# Patient Record
Sex: Female | Born: 1960 | Race: White | Hispanic: No | Marital: Married | Smoking: Former smoker
Health system: Southern US, Community
[De-identification: ages and names within clinical notes are randomized; demographics above are authoritative.]

## PROBLEM LIST (undated history)

## (undated) DIAGNOSIS — M25569 Pain in unspecified knee: Secondary | ICD-10-CM

## (undated) DIAGNOSIS — E039 Hypothyroidism, unspecified: Secondary | ICD-10-CM

## (undated) DIAGNOSIS — H8090 Unspecified otosclerosis, unspecified ear: Secondary | ICD-10-CM

## (undated) DIAGNOSIS — E785 Hyperlipidemia, unspecified: Secondary | ICD-10-CM

## (undated) DIAGNOSIS — E559 Vitamin D deficiency, unspecified: Secondary | ICD-10-CM

## (undated) DIAGNOSIS — Z8781 Personal history of (healed) traumatic fracture: Secondary | ICD-10-CM

## (undated) DIAGNOSIS — H9193 Unspecified hearing loss, bilateral: Secondary | ICD-10-CM

## (undated) DIAGNOSIS — Z780 Asymptomatic menopausal state: Secondary | ICD-10-CM

## (undated) DIAGNOSIS — T7840XA Allergy, unspecified, initial encounter: Secondary | ICD-10-CM

## (undated) DIAGNOSIS — E538 Deficiency of other specified B group vitamins: Secondary | ICD-10-CM

## (undated) DIAGNOSIS — E663 Overweight: Secondary | ICD-10-CM

## (undated) DIAGNOSIS — E079 Disorder of thyroid, unspecified: Secondary | ICD-10-CM

## (undated) DIAGNOSIS — F419 Anxiety disorder, unspecified: Secondary | ICD-10-CM

## (undated) HISTORY — DX: Overweight: E66.3

## (undated) HISTORY — DX: Pain in unspecified knee: M25.569

## (undated) HISTORY — DX: Hyperlipidemia, unspecified: E78.5

## (undated) HISTORY — DX: Anxiety disorder, unspecified: F41.9

## (undated) HISTORY — PX: KNEE SURGERY: SHX244

## (undated) HISTORY — DX: Allergy, unspecified, initial encounter: T78.40XA

## (undated) HISTORY — DX: Personal history of (healed) traumatic fracture: Z87.81

## (undated) HISTORY — DX: Disorder of thyroid, unspecified: E07.9

## (undated) HISTORY — PX: EXTERNAL EAR SURGERY: SHX627

## (undated) HISTORY — DX: Unspecified otosclerosis, unspecified ear: H80.90

## (undated) HISTORY — DX: Deficiency of other specified B group vitamins: E53.8

## (undated) HISTORY — DX: Asymptomatic menopausal state: Z78.0

## (undated) HISTORY — DX: Unspecified hearing loss, bilateral: H91.93

## (undated) HISTORY — DX: Vitamin D deficiency, unspecified: E55.9

---

## 2006-03-02 ENCOUNTER — Ambulatory Visit: Payer: Self-pay | Admitting: Unknown Physician Specialty

## 2008-04-22 ENCOUNTER — Ambulatory Visit: Payer: Self-pay | Admitting: Physician Assistant

## 2008-06-25 ENCOUNTER — Ambulatory Visit: Payer: Self-pay | Admitting: Gastroenterology

## 2008-06-25 LAB — HM COLONOSCOPY

## 2008-07-07 ENCOUNTER — Ambulatory Visit: Payer: Self-pay | Admitting: Orthopedic Surgery

## 2009-03-04 ENCOUNTER — Ambulatory Visit: Payer: Self-pay | Admitting: Orthopedic Surgery

## 2009-03-11 ENCOUNTER — Ambulatory Visit: Payer: Self-pay | Admitting: Orthopedic Surgery

## 2009-11-18 ENCOUNTER — Ambulatory Visit: Payer: Self-pay | Admitting: Family Medicine

## 2009-12-06 ENCOUNTER — Ambulatory Visit: Payer: Self-pay | Admitting: Family Medicine

## 2011-09-20 ENCOUNTER — Ambulatory Visit: Payer: Self-pay | Admitting: Family Medicine

## 2011-09-22 LAB — HM PAP SMEAR: HM PAP: NORMAL

## 2012-11-29 ENCOUNTER — Ambulatory Visit: Payer: Self-pay | Admitting: Family Medicine

## 2013-01-29 ENCOUNTER — Ambulatory Visit: Payer: Self-pay | Admitting: Orthopedic Surgery

## 2013-01-29 DIAGNOSIS — Z8781 Personal history of (healed) traumatic fracture: Secondary | ICD-10-CM | POA: Insufficient documentation

## 2013-04-22 ENCOUNTER — Ambulatory Visit: Payer: Self-pay | Admitting: Orthopedic Surgery

## 2013-12-06 LAB — LIPID PANEL
Cholesterol: 223 — AB (ref 0–200)
HDL: 72 — AB (ref 35–70)
LDL CALC: 129
TRIGLYCERIDES: 110 (ref 40–160)

## 2013-12-06 LAB — BASIC METABOLIC PANEL: GLUCOSE: 90

## 2013-12-06 LAB — HEMOGLOBIN A1C: Hemoglobin A1C: 5.3

## 2014-01-26 LAB — HM MAMMOGRAPHY: HM Mammogram: NORMAL

## 2014-01-27 ENCOUNTER — Ambulatory Visit: Payer: Self-pay | Admitting: Family Medicine

## 2014-03-06 ENCOUNTER — Other Ambulatory Visit: Payer: Self-pay | Admitting: Oncology

## 2014-03-13 ENCOUNTER — Ambulatory Visit: Payer: Self-pay | Admitting: Gastroenterology

## 2014-08-14 NOTE — Op Note (Signed)
PATIENT NAME:  Taylor Gilmore, Taylor Gilmore MR#:  [id number] DATE OF BIRTH:  10-21-1960  DATE OF PROCEDURE:  01/29/2013  PREOPERATIVE DIAGNOSIS: Left shoulder fracture-dislocation.   POSTOPERATIVE DIAGNOSIS: Left shoulder fracture-dislocation.   PROCEDURE: Closed reduction left glenohumeral dislocation and tuberosity fracture.   ANESTHESIA:  General.  SURGEON: Hessie Knows, M.D.   DESCRIPTION OF PROCEDURE: The patient was brought to the operating room and after adequate anesthesia was obtained, appropriate patient identification and timeout procedures were completed. A C-arm was brought in and under fluoroscopic exam, reduction was attempted first applying traction and this gave very little improvement in position. With significant internal rotation and lateral traction, the humeral head and found to slide against the glenoid and a palpable reduction was felt with the arm brought into adduction against the chest wall and the hand against the abdomen. The tuberosity fracture appeared to be essentially anatomically reduced. A shoulder immobilizer was applied and the patient was sent to the recovery room in stable condition and there were no blood loss. No complications. No implants. C-arm view was saved showing the shoulder relocated.     ____________________________ Laurene Footman, MD mjm:cc D: 01/29/2013 19:30:55 ET T: 01/29/2013 20:23:42 ET JOB#: [id number]  cc: Laurene Footman, MD, <Dictator> Laurene Footman MD ELECTRONICALLY SIGNED 01/30/2013 6:56

## 2014-08-14 NOTE — H&P (Signed)
   Subjective/Chief Complaint left shoulder pain   History of Present Illness left arm went out from under her when planking at [facility] during exercise class.  Unsuccessful attemps in ER to reduce shoulder. right hand dominant, no prior left shoulder problems   Past Med/Surgical Hx:  Hypercholesterolemia:   Hypothyroidism:   Left ear surgery:   ALLERGIES:  No Known Allergies:   HOME MEDICATIONS: Medication Instructions Status  simvastatin 10 mg oral tablet 1 tab(s) orally once a day (at bedtime) Active  Synthroid 75 mcg (0.075 mg) oral tablet 1 tab(s) orally once a day Active  Vitamin B-12 1 tab(s) orally 3 to 4 times a week Active  Vitamin D3 1 tab(s) orally once a day Active   Family and Social History:  Family History Non-Contributory   Social History negative tobacco   Place of Living Home   Review of Systems:  Subjective/Chief Complaint left shoulder pain, severe   Physical Exam:  GEN well developed, well nourished   HEENT hearing intact to voice, good dentition   NECK supple   RESP normal resp effort  clear BS   CARD regular rate  no murmur   ABD denies tenderness   EXTR N/V intact left arm, anterior shoulder fullness secondary to dislocation   SKIN normal to palpation   NEURO motor/sensory function intact   PSYCH alert, A+O to time, place, person    Assessment/Admission Diagnosis shoulder fracture dislocation, unable to reduce in ER with iv sedation.   Plan closed reduction under anesthesia. possible open reduction if closed reduction fails. discussed risks, benefits and possible complications and she understands.   Electronic Signatures: Laurene Footman (MD)  (Signed 08-Oct-14 16:08)  Authored: CHIEF COMPLAINT and HISTORY, PAST MEDICAL/SURGIAL HISTORY, ALLERGIES, HOME MEDICATIONS, FAMILY AND SOCIAL HISTORY, REVIEW OF SYSTEMS, PHYSICAL EXAM, ASSESSMENT AND PLAN   Last Updated: 08-Oct-14 16:08 by Laurene Footman (MD)

## 2014-08-18 LAB — LIPID PANEL
CHOLESTEROL: 237 mg/dL — AB (ref 0–200)
HDL: 65 mg/dL (ref 35–70)
LDL CALC: 142 mg/dL
TRIGLYCERIDES: 152 mg/dL (ref 40–160)

## 2014-11-30 ENCOUNTER — Other Ambulatory Visit: Payer: Self-pay

## 2014-11-30 MED ORDER — LEVOTHYROXINE SODIUM 100 MCG PO TABS: 100.0000 ug | ORAL_TABLET | Freq: Every day | ORAL | Status: DC

## 2014-11-30 NOTE — Telephone Encounter (Signed)
Patient requesting refill. 

## 2014-12-31 ENCOUNTER — Other Ambulatory Visit: Payer: Self-pay | Admitting: Family Medicine

## 2014-12-31 DIAGNOSIS — Z1231 Encounter for screening mammogram for malignant neoplasm of breast: Secondary | ICD-10-CM

## 2015-01-04 ENCOUNTER — Telehealth: Payer: Self-pay | Admitting: Family Medicine

## 2015-01-04 DIAGNOSIS — H9193 Unspecified hearing loss, bilateral: Secondary | ICD-10-CM | POA: Insufficient documentation

## 2015-01-04 DIAGNOSIS — F419 Anxiety disorder, unspecified: Secondary | ICD-10-CM | POA: Insufficient documentation

## 2015-01-04 DIAGNOSIS — E785 Hyperlipidemia, unspecified: Secondary | ICD-10-CM

## 2015-01-04 DIAGNOSIS — M25562 Pain in left knee: Secondary | ICD-10-CM | POA: Insufficient documentation

## 2015-01-04 DIAGNOSIS — H8093 Unspecified otosclerosis, bilateral: Secondary | ICD-10-CM

## 2015-01-04 DIAGNOSIS — E663 Overweight: Secondary | ICD-10-CM | POA: Insufficient documentation

## 2015-01-04 DIAGNOSIS — Z79899 Other long term (current) drug therapy: Secondary | ICD-10-CM

## 2015-01-04 DIAGNOSIS — E039 Hypothyroidism, unspecified: Secondary | ICD-10-CM | POA: Insufficient documentation

## 2015-01-04 DIAGNOSIS — Z131 Encounter for screening for diabetes mellitus: Secondary | ICD-10-CM

## 2015-01-04 DIAGNOSIS — E538 Deficiency of other specified B group vitamins: Secondary | ICD-10-CM

## 2015-01-04 DIAGNOSIS — E559 Vitamin D deficiency, unspecified: Secondary | ICD-10-CM

## 2015-01-04 DIAGNOSIS — F413 Other mixed anxiety disorders: Secondary | ICD-10-CM

## 2015-01-04 NOTE — Telephone Encounter (Signed)
PT WANTS LAB SLIP FOR LABS BEFORE HER APPT ON 01-11-15.

## 2015-01-09 LAB — CBC WITH DIFFERENTIAL/PLATELET
BASOS: 1 %
Basophils Absolute: 0 10*3/uL (ref 0.0–0.2)
EOS (ABSOLUTE): 0.1 10*3/uL (ref 0.0–0.4)
EOS: 3 %
HEMATOCRIT: 37.8 % (ref 34.0–46.6)
Hemoglobin: 12.5 g/dL (ref 11.1–15.9)
Immature Grans (Abs): 0 10*3/uL (ref 0.0–0.1)
Immature Granulocytes: 0 %
LYMPHS ABS: 1.4 10*3/uL (ref 0.7–3.1)
Lymphs: 35 %
MCH: 30.9 pg (ref 26.6–33.0)
MCHC: 33.1 g/dL (ref 31.5–35.7)
MCV: 93 fL (ref 79–97)
MONOCYTES: 10 %
MONOS ABS: 0.4 10*3/uL (ref 0.1–0.9)
NEUTROS ABS: 2.1 10*3/uL (ref 1.4–7.0)
Neutrophils: 51 %
Platelets: 193 10*3/uL (ref 150–379)
RBC: 4.05 x10E6/uL (ref 3.77–5.28)
RDW: 13.4 % (ref 12.3–15.4)
WBC: 4 10*3/uL (ref 3.4–10.8)

## 2015-01-09 LAB — VITAMIN B12: Vitamin B-12: 823 pg/mL (ref 211–946)

## 2015-01-09 LAB — COMPREHENSIVE METABOLIC PANEL
ALBUMIN: 4.5 g/dL (ref 3.5–5.5)
ALT: 16 IU/L (ref 0–32)
AST: 22 IU/L (ref 0–40)
Albumin/Globulin Ratio: 2.5 (ref 1.1–2.5)
Alkaline Phosphatase: 62 IU/L (ref 39–117)
BUN / CREAT RATIO: 18 (ref 9–23)
BUN: 13 mg/dL (ref 6–24)
Bilirubin Total: 0.2 mg/dL (ref 0.0–1.2)
CO2: 26 mmol/L (ref 18–29)
CREATININE: 0.74 mg/dL (ref 0.57–1.00)
Calcium: 9.1 mg/dL (ref 8.7–10.2)
Chloride: 103 mmol/L (ref 97–108)
GFR, EST AFRICAN AMERICAN: 107 mL/min/{1.73_m2} (ref >59)
GFR, EST NON AFRICAN AMERICAN: 93 mL/min/{1.73_m2} (ref >59)
GLOBULIN, TOTAL: 1.8 g/dL (ref 1.5–4.5)
GLUCOSE: 92 mg/dL (ref 65–99)
Potassium: 4.6 mmol/L (ref 3.5–5.2)
SODIUM: 142 mmol/L (ref 134–144)
TOTAL PROTEIN: 6.3 g/dL (ref 6.0–8.5)

## 2015-01-09 LAB — LIPID PANEL
CHOL/HDL RATIO: 3.6 ratio (ref 0.0–4.4)
Cholesterol, Total: 234 mg/dL — ABNORMAL HIGH (ref 100–199)
HDL: 65 mg/dL (ref >39)
LDL Calculated: 149 mg/dL — ABNORMAL HIGH (ref 0–99)
TRIGLYCERIDES: 101 mg/dL (ref 0–149)
VLDL Cholesterol Cal: 20 mg/dL (ref 5–40)

## 2015-01-09 LAB — TSH: TSH: 1.38 u[IU]/mL (ref 0.450–4.500)

## 2015-01-09 LAB — HEMOGLOBIN A1C
Est. average glucose Bld gHb Est-mCnc: 111 mg/dL
Hgb A1c MFr Bld: 5.5 % (ref 4.8–5.6)

## 2015-01-09 LAB — VITAMIN D 25 HYDROXY (VIT D DEFICIENCY, FRACTURES): Vit D, 25-Hydroxy: 29.5 ng/mL — ABNORMAL LOW (ref 30.0–100.0)

## 2015-01-10 ENCOUNTER — Encounter: Payer: Self-pay | Admitting: Family Medicine

## 2015-01-10 DIAGNOSIS — N951 Menopausal and female climacteric states: Secondary | ICD-10-CM | POA: Insufficient documentation

## 2015-01-10 DIAGNOSIS — H8093 Unspecified otosclerosis, bilateral: Secondary | ICD-10-CM | POA: Insufficient documentation

## 2015-01-10 DIAGNOSIS — M25569 Pain in unspecified knee: Secondary | ICD-10-CM | POA: Insufficient documentation

## 2015-01-10 DIAGNOSIS — J309 Allergic rhinitis, unspecified: Secondary | ICD-10-CM | POA: Insufficient documentation

## 2015-01-11 ENCOUNTER — Ambulatory Visit (INDEPENDENT_AMBULATORY_CARE_PROVIDER_SITE_OTHER): Payer: 59 | Admitting: Family Medicine

## 2015-01-11 ENCOUNTER — Encounter: Payer: Self-pay | Admitting: Family Medicine

## 2015-01-11 VITALS — BP 124/70 | HR 75 | Temp 97.8°F | Resp 16 | Ht 63.0 in | Wt 154.8 lb

## 2015-01-11 DIAGNOSIS — Z0000 Encounter for general adult medical examination without abnormal findings: Secondary | ICD-10-CM | POA: Diagnosis not present

## 2015-01-11 DIAGNOSIS — Z1239 Encounter for other screening for malignant neoplasm of breast: Secondary | ICD-10-CM

## 2015-01-11 DIAGNOSIS — Z7189 Other specified counseling: Secondary | ICD-10-CM | POA: Diagnosis not present

## 2015-01-11 DIAGNOSIS — Z124 Encounter for screening for malignant neoplasm of cervix: Secondary | ICD-10-CM

## 2015-01-11 DIAGNOSIS — Z719 Counseling, unspecified: Secondary | ICD-10-CM

## 2015-01-11 DIAGNOSIS — Z23 Encounter for immunization: Secondary | ICD-10-CM | POA: Diagnosis not present

## 2015-01-11 DIAGNOSIS — Z01419 Encounter for gynecological examination (general) (routine) without abnormal findings: Secondary | ICD-10-CM

## 2015-01-11 DIAGNOSIS — Z114 Encounter for screening for human immunodeficiency virus [HIV]: Secondary | ICD-10-CM | POA: Diagnosis not present

## 2015-01-11 MED ORDER — ASPIRIN EC 81 MG PO TBEC: 81.0000 mg | DELAYED_RELEASE_TABLET | Freq: Every day | ORAL | Status: DC

## 2015-01-11 MED ORDER — LEVOTHYROXINE SODIUM 100 MCG PO TABS: 100.0000 ug | ORAL_TABLET | Freq: Every day | ORAL | Status: DC

## 2015-01-11 NOTE — Progress Notes (Signed)
Name: Taylor Gilmore   MRN: [id number]    DOB: Dec 06, 1960   Date:01/11/2015       Progress Note  Subjective  Chief Complaint  Chief Complaint  Patient presents with  . Annual Exam    HPI  Well Woman: she is having some sinus/allergy symptoms and is taking otc medication prn, otherwise no problems. She denies urge incontinence, only has stress incontinence with jumping jacks  Patient Active Problem List   Diagnosis Date Noted  . Anterior knee pain 01/10/2015  . Symptomatic menopausal or female climacteric states 01/10/2015  . Otosclerosis of both ears 01/10/2015  . Allergic rhinitis 01/10/2015  . Hypothyroidism, adult 01/04/2015  . Dyslipidemia 01/04/2015  . Overweight (BMI 25.0-29.9) 01/04/2015  . Vitamin D deficiency 01/04/2015  . B12 deficiency 01/04/2015  . Left anterior knee pain 01/04/2015  . Bilateral hearing loss 01/04/2015  . Anxiety disorder 01/04/2015  . Personal history of healed traumatic fracture 01/29/2013    Past Surgical History  Procedure Laterality Date  . Knee surgery Right   . External ear surgery Bilateral     Family History  Problem Relation Age of Onset  . Hypertension Mother   . Alcohol abuse Father   . Cancer Father     Colon    Social History   Social History  . Marital Status: Married    Spouse Name: N/A  . Number of Children: N/A  . Years of Education: N/A   Occupational History  . Not on file.   Social History Main Topics  . Smoking status: Former Smoker    Quit date: 04/24/1993  . Smokeless tobacco: Not on file  . Alcohol Use: No  . Drug Use: No  . Sexual Activity: Not Currently   Other Topics Concern  . Not on file   Social History Narrative     Current outpatient prescriptions:  .  Cholecalciferol (VITAMIN D HIGH POTENCY) 1000 UNITS capsule, Take 1 tablet by mouth daily., Disp: , Rfl:  .  LORazepam (ATIVAN) 1 MG tablet, Take 1 tablet by mouth as needed., Disp: , Rfl:  .  levothyroxine (SYNTHROID,  LEVOTHROID) 100 MCG tablet, Take 1 tablet (100 mcg total) by mouth daily. Take 1 tablet by mouth Monday through Saturday and 1 and 1/2 tablets by mouth on Sunday, Disp: 98 tablet, Rfl: 0 .  Methylcobalamin (B12-ACTIVE) 1 MG CHEW, Chew 1 tablet by mouth daily., Disp: , Rfl:   No Known Allergies   ROS  Constitutional: Negative for fever or significant  weight change.  Respiratory: Negative for cough and shortness of breath.   Cardiovascular: Negative for chest pain or palpitations.  Gastrointestinal: Negative for abdominal pain, no bowel changes.  Musculoskeletal: Negative for gait problem or joint swelling.  Skin: Negative for rash.  Neurological: Negative for dizziness or headache.  No other specific complaints in a complete review of systems (except as listed in HPI above).  Objective  Filed Vitals:   01/11/15 0838  BP: 124/70  Pulse: 75  Temp: 97.8 F (36.6 C)  TempSrc: Oral  Resp: 16  Height: 5\' 3"  (1.6 m)  Weight: 154 lb 12.8 oz (70.217 kg)  SpO2: 98%    Body mass index is 27.43 kg/(m^2).  Physical Exam  Constitutional: Patient appears well-developed and well-nourished. No distress.  HENT: Head: Normocephalic and atraumatic. Ears: scarring on both TM; Nose: Nose normal. Mouth/Throat: Oropharynx is clear and moist. No oropharyngeal exudate.  Eyes: Conjunctivae and EOM are normal. Pupils are equal, round, and reactive  to light. No scleral icterus.  Neck: Normal range of motion. Neck supple. No JVD present. No thyromegaly present.  Cardiovascular: Normal rate, regular rhythm and normal heart sounds.  No murmur heard. No BLE edema. Pulmonary/Chest: Effort normal and breath sounds normal. No respiratory distress. Abdominal: Soft. Bowel sounds are normal, no distension. There is no tenderness. no masses Breast: no lumps or masses, no nipple discharge or rashes FEMALE GENITALIA:  External genitalia normal External urethra normal Vaginal vault normal without discharge or  lesions. Vaginal atrophy noted Cervix normal without discharge or lesions Bimanual exam normal without masses RECTAL: not done Musculoskeletal: Normal range of motion, no joint effusions. No gross deformities Neurological: he is alert and oriented to person, place, and time. No cranial nerve deficit. Coordination, balance, strength, speech and gait are normal.  Skin: Skin is warm and dry. No rash noted. No erythema.  Psychiatric: Patient has a normal mood and affect. behavior is normal. Judgment and thought content normal.  Recent Results (from the past 2160 hour(s))  TSH     Status: None   Collection Time: 01/08/15  8:50 AM  Result Value Ref Range   TSH 1.380 0.450 - 4.500 uIU/mL  Vitamin B12     Status: None   Collection Time: 01/08/15  8:50 AM  Result Value Ref Range   Vitamin B-12 823 211 - 946 pg/mL  Vit D  25 hydroxy (rtn osteoporosis monitoring)     Status: Abnormal   Collection Time: 01/08/15  8:50 AM  Result Value Ref Range   Vit D, 25-Hydroxy 29.5 (L) 30.0 - 100.0 ng/mL    Comment: Vitamin D deficiency has been defined by the [place] and an Endocrine Society practice guideline as a level of serum 25-OH vitamin D less than 20 ng/mL (1,2). The Endocrine Society went on to further define vitamin D insufficiency as a level between 21 and 29 ng/mL (2). 1. IOM (Institute of Medicine). 2010. Dietary reference    intakes for calcium and D. [place]: The    [employer]. 2. Holick MF, Binkley [place], Bischoff-Ferrari HA, et al.    Evaluation, treatment, and prevention of vitamin D    deficiency: an Endocrine Society clinical practice    guideline. JCEM. 2011 Jul; 96(7):1911-30.   Comprehensive metabolic panel     Status: None   Collection Time: 01/08/15  8:50 AM  Result Value Ref Range   Glucose 92 65 - 99 mg/dL   BUN 13 6 - 24 mg/dL   Creatinine, Ser 0.74 0.57 - 1.00 mg/dL   GFR calc non Af Amer 93 >59 mL/min/1.73   GFR calc Af Amer 107 >59  mL/min/1.73   BUN/Creatinine Ratio 18 9 - 23   Sodium 142 134 - 144 mmol/L   Potassium 4.6 3.5 - 5.2 mmol/L   Chloride 103 97 - 108 mmol/L   CO2 26 18 - 29 mmol/L   Calcium 9.1 8.7 - 10.2 mg/dL   Total Protein 6.3 6.0 - 8.5 g/dL   Albumin 4.5 3.5 - 5.5 g/dL   Globulin, Total 1.8 1.5 - 4.5 g/dL   Albumin/Globulin Ratio 2.5 1.1 - 2.5   Bilirubin Total 0.2 0.0 - 1.2 mg/dL   Alkaline Phosphatase 62 39 - 117 IU/L   AST 22 0 - 40 IU/L   ALT 16 0 - 32 IU/L  CBC with Differential/Platelet     Status: None   Collection Time: 01/08/15  8:50 AM  Result Value Ref Range   WBC  4.0 3.4 - 10.8 x10E3/uL   RBC 4.05 3.77 - 5.28 x10E6/uL   Hemoglobin 12.5 11.1 - 15.9 g/dL   Hematocrit 37.8 34.0 - 46.6 %   MCV 93 79 - 97 fL   MCH 30.9 26.6 - 33.0 pg   MCHC 33.1 31.5 - 35.7 g/dL   RDW 13.4 12.3 - 15.4 %   Platelets 193 150 - 379 x10E3/uL   Neutrophils 51 %   Lymphs 35 %   Monocytes 10 %   Eos 3 %   Basos 1 %   Neutrophils Absolute 2.1 1.4 - 7.0 x10E3/uL   Lymphocytes Absolute 1.4 0.7 - 3.1 x10E3/uL   Monocytes Absolute 0.4 0.1 - 0.9 x10E3/uL   EOS (ABSOLUTE) 0.1 0.0 - 0.4 x10E3/uL   Basophils Absolute 0.0 0.0 - 0.2 x10E3/uL   Immature Granulocytes 0 %   Immature Grans (Abs) 0.0 0.0 - 0.1 x10E3/uL  Lipid panel     Status: Abnormal   Collection Time: 01/08/15  8:50 AM  Result Value Ref Range   Cholesterol, Total 234 (H) 100 - 199 mg/dL   Triglycerides 101 0 - 149 mg/dL   HDL 65 >39 mg/dL    Comment: According to ATP-III Guidelines, HDL-C >59 mg/dL is considered a negative risk factor for CHD.    VLDL Cholesterol Cal 20 5 - 40 mg/dL   LDL Calculated 149 (H) 0 - 99 mg/dL   Chol/HDL Ratio 3.6 0.0 - 4.4 ratio units    Comment:                                   T. Chol/HDL Ratio                                             Men  Women                               1/2 Avg.Risk  3.4    3.3                                   Avg.Risk  5.0    4.4                                2X Avg.Risk  9.6     7.1                                3X Avg.Risk 23.4   11.0   Hemoglobin A1c     Status: None   Collection Time: 01/08/15  8:50 AM  Result Value Ref Range   Hgb A1c MFr Bld 5.5 4.8 - 5.6 %    Comment:          Pre-diabetes: 5.7 - 6.4          Diabetes: >6.4          Glycemic control for adults with diabetes: <7.0    Est. average glucose Bld gHb Est-mCnc 111 mg/dL      PHQ2/9: Depression screen [facility] 2/9 01/11/2015  Decreased Interest 0  Down, Depressed, Hopeless 0  PHQ - 2 Score 0     Fall Risk: Fall Risk  01/11/2015  Falls in the past year? No      Functional Status Survey: Is the patient deaf or have difficulty hearing?: No Does the patient have difficulty seeing, even when wearing glasses/contacts?: Yes (glasses) Does the patient have difficulty concentrating, remembering, or making decisions?: No Does the patient have difficulty walking or climbing stairs?: No Does the patient have difficulty dressing or bathing?: No Does the patient have difficulty doing errands alone such as visiting a doctor's office or shopping?: No    Assessment & Plan  1. Well woman exam   2. Needs flu shot  - Flu Vaccine QUAD 36+ mos PF IM (Fluarix & Fluzone Quad PF)  3. Health counseling Discussed importance of 150 minutes of physical activity weekly, eat two servings of fish weekly, eat one serving of tree nuts ( cashews, pistachios, pecans, almonds.Marland Kitchen) every other day, eat 6 servings of fruit/vegetables daily and drink plenty of water and avoid sweet beverages.    4. Cervical cancer screening  - Pap IG, CT/NG NAA, and HPV (high risk)  5. Breast cancer screening Already scheduled   6. Encounter for screening for HIV  - HIV antibody

## 2015-01-11 NOTE — Patient Instructions (Signed)
Discussed importance of 150 minutes of physical activity weekly, eat two servings of fish weekly, eat one serving of tree nuts ( cashews, pistachios, pecans, almonds.Marland Kitchen) every other day, eat 6 servings of fruit/vegetables daily and drink plenty of water and avoid sweet beverages. Start Aspirin 81 mg daily

## 2015-01-12 LAB — HIV ANTIBODY (ROUTINE TESTING W REFLEX): HIV SCREEN 4TH GENERATION: NONREACTIVE

## 2015-01-13 LAB — PLEASE NOTE

## 2015-01-14 LAB — PAP IG, CT-NG NAA, HPV HIGH-RISK: PAP Smear Comment: 0

## 2015-01-14 NOTE — Progress Notes (Signed)
Patient notified and states she will come back for just a piece of mind to have her Pap redone.

## 2015-01-25 ENCOUNTER — Ambulatory Visit (INDEPENDENT_AMBULATORY_CARE_PROVIDER_SITE_OTHER): Payer: 59 | Admitting: Family Medicine

## 2015-01-25 ENCOUNTER — Encounter: Payer: Self-pay | Admitting: Family Medicine

## 2015-01-25 VITALS — BP 116/66 | HR 76 | Temp 97.5°F | Resp 18 | Ht 63.0 in | Wt 156.1 lb

## 2015-01-25 DIAGNOSIS — Z124 Encounter for screening for malignant neoplasm of cervix: Secondary | ICD-10-CM

## 2015-01-25 NOTE — Progress Notes (Signed)
Name: Taylor Gilmore   MRN: [id number]    DOB: 07-14-1960   Date:01/25/2015       Progress Note  Subjective  Chief Complaint  Chief Complaint  Patient presents with  . Gynecologic Exam    Due to last pap reading did not have enough cells to perform, patient would like pap smear redone.    HPI  See above  Patient Active Problem List   Diagnosis Date Noted  . Anterior knee pain 01/10/2015  . Symptomatic menopausal or female climacteric states 01/10/2015  . Otosclerosis of both ears 01/10/2015  . Allergic rhinitis 01/10/2015  . Hypothyroidism, adult 01/04/2015  . Dyslipidemia 01/04/2015  . Overweight (BMI 25.0-29.9) 01/04/2015  . Vitamin D deficiency 01/04/2015  . B12 deficiency 01/04/2015  . Left anterior knee pain 01/04/2015  . Bilateral hearing loss 01/04/2015  . Anxiety disorder 01/04/2015  . Personal history of healed traumatic fracture 01/29/2013    Social History  Substance Use Topics  . Smoking status: Former Smoker    Quit date: 04/24/1993  . Smokeless tobacco: Not on file  . Alcohol Use: No     Current outpatient prescriptions:  .  aspirin EC 81 MG tablet, Take 1 tablet (81 mg total) by mouth daily., Disp: 30 tablet, Rfl: 0 .  Cholecalciferol (VITAMIN D HIGH POTENCY) 1000 UNITS capsule, Take 1 tablet by mouth daily., Disp: , Rfl:  .  levothyroxine (SYNTHROID, LEVOTHROID) 100 MCG tablet, Take 1 tablet (100 mcg total) by mouth daily. Take 1 tablet by mouth Monday through Saturday and 1 and 1/2 tablets by mouth on Sunday, Disp: 98 tablet, Rfl: 0 .  LORazepam (ATIVAN) 1 MG tablet, Take 1 tablet by mouth as needed., Disp: , Rfl:  .  Methylcobalamin (B12-ACTIVE) 1 MG CHEW, Chew 1 tablet by mouth daily., Disp: , Rfl:   No Known Allergies  ROS  Not applicable  Objective  Filed Vitals:   01/25/15 1530  BP: 116/66  Pulse: 76  Temp: 97.5 F (36.4 C)  TempSrc: Oral  Resp: 18  Height: 5\' 3"  (1.6 m)  Weight: 156 lb 1.6 oz (70.806 kg)  SpO2: 97%     Body mass index is 27.66 kg/(m^2).    Physical Exam  Pelvic exam performed, some vaginal atrophy noticed Pap smear collected - not enough cells on previous pap   Recent Results (from the past 2160 hour(s))  TSH     Status: None   Collection Time: 01/08/15  8:50 AM  Result Value Ref Range   TSH 1.380 0.450 - 4.500 uIU/mL  Vitamin B12     Status: None   Collection Time: 01/08/15  8:50 AM  Result Value Ref Range   Vitamin B-12 823 211 - 946 pg/mL  Vit D  25 hydroxy (rtn osteoporosis monitoring)     Status: Abnormal   Collection Time: 01/08/15  8:50 AM  Result Value Ref Range   Vit D, 25-Hydroxy 29.5 (L) 30.0 - 100.0 ng/mL    Comment: Vitamin D deficiency has been defined by the [place] and an Endocrine Society practice guideline as a level of serum 25-OH vitamin D less than 20 ng/mL (1,2). The Endocrine Society went on to further define vitamin D insufficiency as a level between 21 and 29 ng/mL (2). 1. IOM (Institute of Medicine). 2010. Dietary reference    intakes for calcium and D. [place]: The    [employer]. 2. Holick MF, Binkley [place], Bischoff-Ferrari HA, et al.    Evaluation,  treatment, and prevention of vitamin D    deficiency: an Endocrine Society clinical practice    guideline. JCEM. 2011 Jul; 96(7):1911-30.   Comprehensive metabolic panel     Status: None   Collection Time: 01/08/15  8:50 AM  Result Value Ref Range   Glucose 92 65 - 99 mg/dL   BUN 13 6 - 24 mg/dL   Creatinine, Ser 0.74 0.57 - 1.00 mg/dL   GFR calc non Af Amer 93 >59 mL/min/1.73   GFR calc Af Amer 107 >59 mL/min/1.73   BUN/Creatinine Ratio 18 9 - 23   Sodium 142 134 - 144 mmol/L   Potassium 4.6 3.5 - 5.2 mmol/L   Chloride 103 97 - 108 mmol/L   CO2 26 18 - 29 mmol/L   Calcium 9.1 8.7 - 10.2 mg/dL   Total Protein 6.3 6.0 - 8.5 g/dL   Albumin 4.5 3.5 - 5.5 g/dL   Globulin, Total 1.8 1.5 - 4.5 g/dL   Albumin/Globulin Ratio 2.5 1.1 - 2.5   Bilirubin Total 0.2  0.0 - 1.2 mg/dL   Alkaline Phosphatase 62 39 - 117 IU/L   AST 22 0 - 40 IU/L   ALT 16 0 - 32 IU/L  CBC with Differential/Platelet     Status: None   Collection Time: 01/08/15  8:50 AM  Result Value Ref Range   WBC 4.0 3.4 - 10.8 x10E3/uL   RBC 4.05 3.77 - 5.28 x10E6/uL   Hemoglobin 12.5 11.1 - 15.9 g/dL   Hematocrit 37.8 34.0 - 46.6 %   MCV 93 79 - 97 fL   MCH 30.9 26.6 - 33.0 pg   MCHC 33.1 31.5 - 35.7 g/dL   RDW 13.4 12.3 - 15.4 %   Platelets 193 150 - 379 x10E3/uL   Neutrophils 51 %   Lymphs 35 %   Monocytes 10 %   Eos 3 %   Basos 1 %   Neutrophils Absolute 2.1 1.4 - 7.0 x10E3/uL   Lymphocytes Absolute 1.4 0.7 - 3.1 x10E3/uL   Monocytes Absolute 0.4 0.1 - 0.9 x10E3/uL   EOS (ABSOLUTE) 0.1 0.0 - 0.4 x10E3/uL   Basophils Absolute 0.0 0.0 - 0.2 x10E3/uL   Immature Granulocytes 0 %   Immature Grans (Abs) 0.0 0.0 - 0.1 x10E3/uL  Lipid panel     Status: Abnormal   Collection Time: 01/08/15  8:50 AM  Result Value Ref Range   Cholesterol, Total 234 (H) 100 - 199 mg/dL   Triglycerides 101 0 - 149 mg/dL   HDL 65 >39 mg/dL    Comment: According to ATP-III Guidelines, HDL-C >59 mg/dL is considered a negative risk factor for CHD.    VLDL Cholesterol Cal 20 5 - 40 mg/dL   LDL Calculated 149 (H) 0 - 99 mg/dL   Chol/HDL Ratio 3.6 0.0 - 4.4 ratio units    Comment:                                   T. Chol/HDL Ratio                                             Men  Women  1/2 Avg.Risk  3.4    3.3                                   Avg.Risk  5.0    4.4                                2X Avg.Risk  9.6    7.1                                3X Avg.Risk 23.4   11.0   Hemoglobin A1c     Status: None   Collection Time: 01/08/15  8:50 AM  Result Value Ref Range   Hgb A1c MFr Bld 5.5 4.8 - 5.6 %    Comment:          Pre-diabetes: 5.7 - 6.4          Diabetes: >6.4          Glycemic control for adults with diabetes: <7.0    Est. average glucose Bld gHb Est-mCnc  111 mg/dL  Pap IG, CT/NG NAA, and HPV (high risk)     Status: None   Collection Time: 01/11/15 12:00 AM  Result Value Ref Range   DIAGNOSIS: Comment     Comment: UNSATISFACTORY FOR EVALUATION.   Specimen adequacy: Comment     Comment: Specimen processed and examined, but unsatisfactory for evaluation of epithelial abnormality because of insufficient cellularity.    CLINICIAN PROVIDED ICD10: Comment     Comment: Z12.4   Performed by: Comment     Comment: Molli Barrows, Cytotechnologist (ASCP)   QC reviewed by: Comment     Comment: Florene Glen, Cytotechnologist (ASCP)   PAP SMEAR COMMENT .    PATHOLOGIST PROVIDED ICD10: Comment     Comment: R87.615   Note: Comment     Comment: The Pap smear is a screening test designed to aid in the detection of premalignant and malignant conditions of the uterine cervix.  It is not a diagnostic procedure and should not be used as the sole means of detecting cervical cancer.  Both false-positive and false-negative reports do occur.    Test Methodology Comment     Comment: This liquid based ThinPrep(R) pap test was screened with the use of an image guided system.   Please Note     Status: None   Collection Time: 01/11/15 12:00 AM  Result Value Ref Range   Please note Comment     Comment: The date and/or time of collection was not indicated on the requisition as required by state and federal law.  The date of receipt of the specimen was used as the collection date if not supplied.   HIV antibody     Status: None   Collection Time: 01/11/15  9:22 AM  Result Value Ref Range   HIV Screen 4th Generation wRfx Non Reactive Non Reactive     Assessment & Plan  1. Cervical cancer screening  - PapLb, HPV, rfx16/18

## 2015-02-01 ENCOUNTER — Telehealth: Payer: Self-pay

## 2015-02-01 ENCOUNTER — Other Ambulatory Visit: Payer: Self-pay | Admitting: Family Medicine

## 2015-02-01 ENCOUNTER — Ambulatory Visit
Admission: RE | Admit: 2015-02-01 | Discharge: 2015-02-01 | Disposition: A | Discharge status: Home / Self Care | Payer: 59 | Source: Ambulatory Visit | Attending: Family Medicine | Admitting: Family Medicine

## 2015-02-01 DIAGNOSIS — Z1231 Encounter for screening mammogram for malignant neoplasm of breast: Secondary | ICD-10-CM | POA: Diagnosis present

## 2015-02-01 LAB — PAPLB, HPV, RFX16/18: PAP SMEAR COMMENT: 0

## 2015-02-01 NOTE — Telephone Encounter (Signed)
-----   Message from Steele Sizer, MD sent at 02/01/2015  1:52 PM EDT ----- Normal pap smear

## 2015-02-01 NOTE — Telephone Encounter (Signed)
Left a message for patient to return my call for lab results.

## 2015-02-03 NOTE — Progress Notes (Signed)
Patient notified of Normal Pap Smear.

## 2015-03-01 ENCOUNTER — Other Ambulatory Visit: Payer: Self-pay | Admitting: Family Medicine

## 2015-03-01 NOTE — Telephone Encounter (Signed)
Patient requesting refill. 

## 2015-05-24 ENCOUNTER — Other Ambulatory Visit: Payer: Self-pay | Admitting: Family Medicine

## 2015-06-22 ENCOUNTER — Encounter: Payer: Self-pay | Admitting: Family Medicine

## 2015-06-22 ENCOUNTER — Ambulatory Visit (INDEPENDENT_AMBULATORY_CARE_PROVIDER_SITE_OTHER): Payer: 59 | Admitting: Family Medicine

## 2015-06-22 VITALS — BP 112/70 | HR 76 | Temp 97.5°F | Resp 16 | Ht 63.0 in | Wt 153.2 lb

## 2015-06-22 DIAGNOSIS — E785 Hyperlipidemia, unspecified: Secondary | ICD-10-CM | POA: Diagnosis not present

## 2015-06-22 DIAGNOSIS — E663 Overweight: Secondary | ICD-10-CM | POA: Diagnosis not present

## 2015-06-22 DIAGNOSIS — E039 Hypothyroidism, unspecified: Secondary | ICD-10-CM | POA: Diagnosis not present

## 2015-06-22 DIAGNOSIS — E559 Vitamin D deficiency, unspecified: Secondary | ICD-10-CM | POA: Diagnosis not present

## 2015-06-22 DIAGNOSIS — E538 Deficiency of other specified B group vitamins: Secondary | ICD-10-CM | POA: Diagnosis not present

## 2015-06-22 NOTE — Progress Notes (Signed)
Name: Taylor Gilmore   MRN: [id number]    DOB: 28-Jul-1960   Date:06/22/2015       Progress Note  Subjective  Chief Complaint  Chief Complaint  Patient presents with  . Obesity    patient has been working hard on trying to lose weight and has not gotten anywhere. she is not sure it it is due to her thyroid or not.  . Advice Only    also patient want to know if she can take Biotin 1090mcg along with her B12 and Vitamin D.    HPI  Overweight: she developed obesity after child bearing age. Her maximum was 180 lbs around 2014.  She has lost 30 lbs over the past few years. She has changed her diet, avoiding junk food, avoids eating out, no sweet beverages, drinks mostly water. Joined a gym, exercises on average 5 classes week. She is frustrated because she has been unable to pass the 150 lbs mark. Discussed metabolic changes and also the fact that most people plateau, but not to get discouraged and continue life style modification since it is so good for her health.   B12 deficiency: explained that she can take Biotin with B12.  Dyslipidemia: she has not been taking Simvastatin for over one year, we will recheck labs. Denies chest pain or SOB   Patient Active Problem List   Diagnosis Date Noted  . Anterior knee pain 01/10/2015  . Symptomatic menopausal or female climacteric states 01/10/2015  . Otosclerosis of both ears 01/10/2015  . Allergic rhinitis 01/10/2015  . Hypothyroidism, adult 01/04/2015  . Dyslipidemia 01/04/2015  . Overweight (BMI 25.0-29.9) 01/04/2015  . Vitamin D deficiency 01/04/2015  . B12 deficiency 01/04/2015  . Left anterior knee pain 01/04/2015  . Bilateral hearing loss 01/04/2015  . Anxiety disorder 01/04/2015  . Personal history of healed traumatic fracture 01/29/2013    Past Surgical History  Procedure Laterality Date  . Knee surgery Right   . External ear surgery Bilateral     Family History  Problem Relation Age of Onset  . Hypertension Mother    . Alcohol abuse Father   . Cancer Father     Colon    Social History   Social History  . Marital Status: Married    Spouse Name: N/A  . Number of Children: N/A  . Years of Education: N/A   Occupational History  . Not on file.   Social History Main Topics  . Smoking status: Former Smoker    Quit date: 04/24/1993  . Smokeless tobacco: Not on file  . Alcohol Use: No  . Drug Use: No  . Sexual Activity: Not Currently   Other Topics Concern  . Not on file   Social History Narrative     Current outpatient prescriptions:  .  Cholecalciferol (VITAMIN D HIGH POTENCY) 1000 UNITS capsule, Take 1 tablet by mouth daily., Disp: , Rfl:  .  LORazepam (ATIVAN) 1 MG tablet, Take 1 tablet by mouth as needed., Disp: , Rfl:  .  Methylcobalamin (B12-ACTIVE) 1 MG CHEW, Chew 1 tablet by mouth daily., Disp: , Rfl:  .  SYNTHROID 100 MCG tablet, Take 1 tablet by mouth  Monday through Saturday and 1 and 1/2 tablets by mouth  on Sunday, Disp: 97 tablet, Rfl: 1 .  aspirin EC 81 MG tablet, Take 1 tablet (81 mg total) by mouth daily. (Patient not taking: Reported on 06/22/2015), Disp: 30 tablet, Rfl: 0  No Known Allergies   ROS  Ten  systems reviewed and is negative except as mentioned in HPI   Objective  Filed Vitals:   06/22/15 1145  BP: 112/70  Pulse: 76  Temp: 97.5 F (36.4 C)  TempSrc: Oral  Resp: 16  Height: 5\' 3"  (1.6 m)  Weight: 153 lb 3.2 oz (69.491 kg)  SpO2: 97%    Body mass index is 27.14 kg/(m^2).  Physical Exam  Constitutional: Patient appears well-developed and well-nourished. Overweight.  No distress.  HEENT: head atraumatic, normocephalic, pupils equal and reactive to light, neck supple, throat within normal limits Cardiovascular: Normal rate, regular rhythm and normal heart sounds.  No murmur heard. No BLE edema. Pulmonary/Chest: Effort normal and breath sounds normal. No respiratory distress. Abdominal: Soft.  There is no tenderness. Psychiatric: Patient has a  normal mood and affect. behavior is normal. Judgment and thought content normal.  PHQ2/9: Depression screen [facility] 2/9 06/22/2015 01/11/2015  Decreased Interest 0 0  Down, Depressed, Hopeless 0 0  PHQ - 2 Score 0 0    Fall Risk: Fall Risk  06/22/2015 01/11/2015  Falls in the past year? No No    Functional Status Survey: Is the patient deaf or have difficulty hearing?: Yes (no has changed with her bilateral hearing loss) Does the patient have difficulty seeing, even when wearing glasses/contacts?: No Does the patient have difficulty concentrating, remembering, or making decisions?: No Does the patient have difficulty walking or climbing stairs?: No Does the patient have difficulty dressing or bathing?: No Does the patient have difficulty doing errands alone such as visiting a doctor's office or shopping?: No    Assessment & Plan  1. Hypothyroidism, adult  - TSH  2. Overweight (BMI 25.0-29.9)  Discussed all optiosns for weight loss medications including Belviq, Qsymia, Saxenda and Contrave. Discussed risk and benefits of each of them. She does not want medications at this time  3. Dyslipidemia  - Comprehensive metabolic panel - Lipid panel  4. B12 deficiency  - Vitamin B12  5. Vitamin D deficiency  - VITAMIN D 25 Hydroxy (Vit-D Deficiency, Fractures)

## 2015-06-26 LAB — TSH: TSH: 1.86 u[IU]/mL (ref 0.450–4.500)

## 2015-06-26 LAB — COMPREHENSIVE METABOLIC PANEL
A/G RATIO: 2.4 (ref 1.1–2.5)
ALT: 13 IU/L (ref 0–32)
AST: 22 IU/L (ref 0–40)
Albumin: 4.6 g/dL (ref 3.5–5.5)
Alkaline Phosphatase: 53 IU/L (ref 39–117)
BILIRUBIN TOTAL: 0.4 mg/dL (ref 0.0–1.2)
BUN/Creatinine Ratio: 17 (ref 9–23)
BUN: 13 mg/dL (ref 6–24)
CHLORIDE: 103 mmol/L (ref 96–106)
CO2: 21 mmol/L (ref 18–29)
Calcium: 9.5 mg/dL (ref 8.7–10.2)
Creatinine, Ser: 0.75 mg/dL (ref 0.57–1.00)
GFR calc Af Amer: 105 mL/min/{1.73_m2} (ref >59)
GFR calc non Af Amer: 91 mL/min/{1.73_m2} (ref >59)
GLUCOSE: 88 mg/dL (ref 65–99)
Globulin, Total: 1.9 g/dL (ref 1.5–4.5)
POTASSIUM: 4.6 mmol/L (ref 3.5–5.2)
Sodium: 141 mmol/L (ref 134–144)
TOTAL PROTEIN: 6.5 g/dL (ref 6.0–8.5)

## 2015-06-26 LAB — VITAMIN D 25 HYDROXY (VIT D DEFICIENCY, FRACTURES): Vit D, 25-Hydroxy: 34.7 ng/mL (ref 30.0–100.0)

## 2015-06-26 LAB — VITAMIN B12: VITAMIN B 12: 959 pg/mL — AB (ref 211–946)

## 2015-06-26 LAB — LIPID PANEL
CHOLESTEROL TOTAL: 252 mg/dL — AB (ref 100–199)
Chol/HDL Ratio: 4.1 ratio units (ref 0.0–4.4)
HDL: 62 mg/dL (ref >39)
LDL Calculated: 174 mg/dL — ABNORMAL HIGH (ref 0–99)
TRIGLYCERIDES: 81 mg/dL (ref 0–149)
VLDL CHOLESTEROL CAL: 16 mg/dL (ref 5–40)

## 2015-06-27 ENCOUNTER — Other Ambulatory Visit: Payer: Self-pay | Admitting: Family Medicine

## 2015-07-02 ENCOUNTER — Telehealth: Payer: Self-pay | Admitting: Family Medicine

## 2015-07-02 NOTE — Telephone Encounter (Signed)
Patient can see her lab results through mychart but have not received a call to actually interpret them. Please return call

## 2015-07-02 NOTE — Telephone Encounter (Signed)
I will call patient again and leave them on her vm, I called her and patient was unavailable so left message for patient to return my call.

## 2015-07-12 ENCOUNTER — Ambulatory Visit: Payer: 59 | Admitting: Family Medicine

## 2015-07-29 ENCOUNTER — Encounter: Payer: Self-pay | Admitting: Family Medicine

## 2015-07-29 ENCOUNTER — Ambulatory Visit (INDEPENDENT_AMBULATORY_CARE_PROVIDER_SITE_OTHER): Payer: 59 | Admitting: Family Medicine

## 2015-07-29 VITALS — BP 114/76 | HR 78 | Temp 97.8°F | Resp 16 | Wt 155.3 lb

## 2015-07-29 DIAGNOSIS — R490 Dysphonia: Secondary | ICD-10-CM

## 2015-07-29 DIAGNOSIS — E785 Hyperlipidemia, unspecified: Secondary | ICD-10-CM | POA: Diagnosis not present

## 2015-07-29 DIAGNOSIS — J40 Bronchitis, not specified as acute or chronic: Secondary | ICD-10-CM

## 2015-07-29 DIAGNOSIS — J069 Acute upper respiratory infection, unspecified: Secondary | ICD-10-CM | POA: Diagnosis not present

## 2015-07-29 MED ORDER — BENZONATATE 100 MG PO CAPS: 100.0000 mg | ORAL_CAPSULE | Freq: Two times a day (BID) | ORAL | Status: DC | PRN

## 2015-07-29 MED ORDER — PREDNISONE 20 MG PO TABS: 20.0000 mg | ORAL_TABLET | Freq: Two times a day (BID) | ORAL | Status: DC

## 2015-07-29 NOTE — Progress Notes (Signed)
Name: Taylor Gilmore   MRN: [id number]    DOB: 1960-07-25   Date:07/29/2015       Progress Note  Subjective  Chief Complaint  Chief Complaint  Patient presents with  . URI    patient presents with sx of : productive cough with greenish mucus since thursday. nonresponsive to Robitussin DM  . Laryngitis    due to cough and psot nasal drip    HPI  URI: symptoms started one week, initially she developed post-nasal drainage and nasal congestion followed by chest congestion and a cough, she is also hoarse now. She has been taking otc medication. Getting gradually better but tired of the cough and hoarseness. No fever, no change in appetite, no nausea or vomiting.   Patient Active Problem List   Diagnosis Date Noted  . Anterior knee pain 01/10/2015  . Symptomatic menopausal or female climacteric states 01/10/2015  . Otosclerosis of both ears 01/10/2015  . Allergic rhinitis 01/10/2015  . Hypothyroidism, adult 01/04/2015  . Dyslipidemia 01/04/2015  . Overweight (BMI 25.0-29.9) 01/04/2015  . Vitamin D deficiency 01/04/2015  . B12 deficiency 01/04/2015  . Left anterior knee pain 01/04/2015  . Bilateral hearing loss 01/04/2015  . Anxiety disorder 01/04/2015  . Personal history of healed traumatic fracture 01/29/2013    Past Surgical History  Procedure Laterality Date  . Knee surgery Right   . External ear surgery Bilateral     Family History  Problem Relation Age of Onset  . Hypertension Mother   . Alcohol abuse Father   . Cancer Father     Colon    Social History   Social History  . Marital Status: Married    Spouse Name: N/A  . Number of Children: N/A  . Years of Education: N/A   Occupational History  . Not on file.   Social History Main Topics  . Smoking status: Former Smoker    Quit date: 04/24/1993  . Smokeless tobacco: Not on file  . Alcohol Use: No  . Drug Use: No  . Sexual Activity: Not Currently   Other Topics Concern  . Not on file   Social  History Narrative     Current outpatient prescriptions:  .  aspirin EC 81 MG tablet, Take 1 tablet (81 mg total) by mouth daily., Disp: 30 tablet, Rfl: 0 .  Cholecalciferol (VITAMIN D HIGH POTENCY) 1000 UNITS capsule, Take 1 tablet by mouth daily., Disp: , Rfl:  .  LORazepam (ATIVAN) 1 MG tablet, Take 1 tablet by mouth as needed., Disp: , Rfl:  .  Methylcobalamin (B12-ACTIVE) 1 MG CHEW, Chew 1 tablet by mouth daily., Disp: , Rfl:  .  SYNTHROID 100 MCG tablet, Take 1 tablet by mouth  Monday through Saturday and 1 and 1/2 tablets by mouth  on Sunday, Disp: 97 tablet, Rfl: 1 .  benzonatate (TESSALON) 100 MG capsule, Take 1-2 capsules (100-200 mg total) by mouth 2 (two) times daily as needed., Disp: 40 capsule, Rfl: 0 .  predniSONE (DELTASONE) 20 MG tablet, Take 1 tablet (20 mg total) by mouth 2 (two) times daily with a meal., Disp: 6 tablet, Rfl: 0  No Known Allergies   ROS  Ten systems reviewed and is negative except as mentioned in HPI   Objective  Filed Vitals:   07/29/15 1213  BP: 114/76  Pulse: 78  Temp: 97.8 F (36.6 C)  TempSrc: Oral  Resp: 16  Weight: 155 lb 4.8 oz (70.444 kg)  SpO2: 98%    Body  mass index is 27.52 kg/(m^2).  Physical Exam  Constitutional: Patient appears well-developed and well-nourished. Obese  No distress.  HEENT: head atraumatic, normocephalic, pupils equal and reactive to light, ears normal TM, neck supple, throat within normal limits Cardiovascular: Normal rate, regular rhythm and normal heart sounds.  No murmur heard. No BLE edema. Pulmonary/Chest: Effort normal and breath sounds normal. No respiratory distress. Abdominal: Soft.  There is no tenderness. Psychiatric: Patient has a normal mood and affect. behavior is normal. Judgment and thought content normal.  Recent Results (from the past 2160 hour(s))  TSH     Status: None   Collection Time: 06/25/15  8:27 AM  Result Value Ref Range   TSH 1.860 0.450 - 4.500 uIU/mL  Vitamin B12      Status: Abnormal   Collection Time: 06/25/15  8:27 AM  Result Value Ref Range   Vitamin B-12 959 (H) 211 - 946 pg/mL  VITAMIN D 25 Hydroxy (Vit-D Deficiency, Fractures)     Status: None   Collection Time: 06/25/15  8:27 AM  Result Value Ref Range   Vit D, 25-Hydroxy 34.7 30.0 - 100.0 ng/mL    Comment: Vitamin D deficiency has been defined by the [place] and an Endocrine Society practice guideline as a level of serum 25-OH vitamin D less than 20 ng/mL (1,2). The Endocrine Society went on to further define vitamin D insufficiency as a level between 21 and 29 ng/mL (2). 1. IOM (Institute of Medicine). 2010. Dietary reference    intakes for calcium and D. [place]: The    [employer]. 2. Holick MF, Binkley Brunson, Bischoff-Ferrari HA, et al.    Evaluation, treatment, and prevention of vitamin D    deficiency: an Endocrine Society clinical practice    guideline. JCEM. 2011 Jul; 96(7):1911-30.   Comprehensive metabolic panel     Status: None   Collection Time: 06/25/15  8:27 AM  Result Value Ref Range   Glucose 88 65 - 99 mg/dL   BUN 13 6 - 24 mg/dL   Creatinine, Ser 0.75 0.57 - 1.00 mg/dL   GFR calc non Af Amer 91 >59 mL/min/1.73   GFR calc Af Amer 105 >59 mL/min/1.73   BUN/Creatinine Ratio 17 9 - 23   Sodium 141 134 - 144 mmol/L   Potassium 4.6 3.5 - 5.2 mmol/L   Chloride 103 96 - 106 mmol/L   CO2 21 18 - 29 mmol/L   Calcium 9.5 8.7 - 10.2 mg/dL   Total Protein 6.5 6.0 - 8.5 g/dL   Albumin 4.6 3.5 - 5.5 g/dL   Globulin, Total 1.9 1.5 - 4.5 g/dL   Albumin/Globulin Ratio 2.4 1.1 - 2.5    Comment: **Effective July 05, 2015 the reference interval**   for A/G Ratio will be changing to:              Age                Female          Female           0 -  7 days       1.1 - 2.3       1.1 - 2.3           8 - 30 days       1.2 - 2.8       1.2 - 2.8           1 -  6 months  1.3 - 3.6       1.3 - 3.6    7 months -  5 years      1.5 - 2.6       1.5 - 2.6               > 5 years      1.2 - 2.2       1.2 - 2.2    Bilirubin Total 0.4 0.0 - 1.2 mg/dL   Alkaline Phosphatase 53 39 - 117 IU/L   AST 22 0 - 40 IU/L   ALT 13 0 - 32 IU/L  Lipid panel     Status: Abnormal   Collection Time: 06/25/15  8:27 AM  Result Value Ref Range   Cholesterol, Total 252 (H) 100 - 199 mg/dL   Triglycerides 81 0 - 149 mg/dL   HDL 62 >39 mg/dL   VLDL Cholesterol Cal 16 5 - 40 mg/dL   LDL Calculated 174 (H) 0 - 99 mg/dL   Chol/HDL Ratio 4.1 0.0 - 4.4 ratio units    Comment:                                   T. Chol/HDL Ratio                                             Men  Women                               1/2 Avg.Risk  3.4    3.3                                   Avg.Risk  5.0    4.4                                2X Avg.Risk  9.6    7.1                                3X Avg.Risk 23.4   11.0       PHQ2/9: Depression screen [facility] 2/9 07/29/2015 06/22/2015 01/11/2015  Decreased Interest 0 0 0  Down, Depressed, Hopeless 0 0 0  PHQ - 2 Score 0 0 0     Fall Risk: Fall Risk  07/29/2015 06/22/2015 01/11/2015  Falls in the past year? No No No      Functional Status Survey: Is the patient deaf or have difficulty hearing?: Yes (no has changed with her bilateral hearing loss) Does the patient have difficulty seeing, even when wearing glasses/contacts?: No Does the patient have difficulty concentrating, remembering, or making decisions?: No Does the patient have difficulty walking or climbing stairs?: No Does the patient have difficulty dressing or bathing?: No Does the patient have difficulty doing errands alone such as visiting a doctor's office or shopping?: No   Assessment & Plan  1. Upper respiratory infection  Continue otc medication   2. Bronchitis  - benzonatate (TESSALON) 100 MG capsule; Take 1-2 capsules (100-200 mg total) by mouth 2 (two)  times daily as needed.  Dispense: 40 capsule; Refill: 0 - predniSONE (DELTASONE) 20 MG tablet; Take 1 tablet  (20 mg total) by mouth 2 (two) times daily with a meal.  Dispense: 6 tablet; Refill: 0  3. Hoarseness  - predniSONE (DELTASONE) 20 MG tablet; Take 1 tablet (20 mg total) by mouth 2 (two) times daily with a meal.  Dispense: 6 tablet; Refill: 0  4. Dyslipidemia  LDL above 170, discussed statin therapy, and she would like to recheck it before she starts therapy.  - Lipid panel

## 2015-09-29 LAB — LIPID PANEL
CHOL/HDL RATIO: 3.6 ratio (ref 0.0–4.4)
CHOLESTEROL TOTAL: 246 mg/dL — AB (ref 100–199)
HDL: 69 mg/dL (ref >39)
LDL CALC: 160 mg/dL — AB (ref 0–99)
TRIGLYCERIDES: 86 mg/dL (ref 0–149)
VLDL CHOLESTEROL CAL: 17 mg/dL (ref 5–40)

## 2015-10-19 ENCOUNTER — Other Ambulatory Visit: Payer: Self-pay | Admitting: Family Medicine

## 2015-10-19 NOTE — Telephone Encounter (Signed)
Patient requesting refill. 

## 2015-12-20 ENCOUNTER — Other Ambulatory Visit: Payer: Self-pay | Admitting: Family Medicine

## 2015-12-20 DIAGNOSIS — Z1231 Encounter for screening mammogram for malignant neoplasm of breast: Secondary | ICD-10-CM

## 2015-12-22 ENCOUNTER — Ambulatory Visit (INDEPENDENT_AMBULATORY_CARE_PROVIDER_SITE_OTHER): Payer: 59 | Admitting: Family Medicine

## 2015-12-22 ENCOUNTER — Encounter: Payer: Self-pay | Admitting: Family Medicine

## 2015-12-22 VITALS — BP 98/64 | HR 78 | Temp 97.9°F | Resp 16 | Ht 62.5 in | Wt 158.9 lb

## 2015-12-22 DIAGNOSIS — E538 Deficiency of other specified B group vitamins: Secondary | ICD-10-CM

## 2015-12-22 DIAGNOSIS — R5383 Other fatigue: Secondary | ICD-10-CM | POA: Diagnosis not present

## 2015-12-22 DIAGNOSIS — E039 Hypothyroidism, unspecified: Secondary | ICD-10-CM | POA: Diagnosis not present

## 2015-12-22 DIAGNOSIS — Z131 Encounter for screening for diabetes mellitus: Secondary | ICD-10-CM | POA: Diagnosis not present

## 2015-12-22 DIAGNOSIS — E559 Vitamin D deficiency, unspecified: Secondary | ICD-10-CM | POA: Diagnosis not present

## 2015-12-22 DIAGNOSIS — E785 Hyperlipidemia, unspecified: Secondary | ICD-10-CM | POA: Diagnosis not present

## 2015-12-22 MED ORDER — SYNTHROID 100 MCG PO TABS: ORAL_TABLET | ORAL | 1 refills | Status: DC

## 2015-12-22 NOTE — Progress Notes (Signed)
Name: Taylor Gilmore   MRN: [id number]    DOB: Jul 27, 1960   Date:12/22/2015       Progress Note  Subjective  Chief Complaint  Chief Complaint  Patient presents with  . Follow-up    labs done at work    HPI   Overweight: she developed obesity after child bearing age. Her maximum was 180 lbs around 2014.  She has lost 30 lbs over the past few years. She has changed her diet, avoiding junk food, avoids eating out, no sweet beverages, drinks mostly water. Joined a gym, exercises on average 4 classes week. She is frustrated because she has been unable to pass the 150 lbs mark. She states her goal weight is 140 lbs. Advised her to log her calories, may not be eating enough food. She has been eating chick filet about once a week and she will change to a wrap instead.   B12 deficiency: explained that she can take Biotin with B12. Last level was normal, she still feels tired  Dyslipidemia: she has not been taking Simvastatin for over one year, we will recheck labs. LDL has improved , but she is disappointed with with elevation of triglycerides   Insomnia: she states she has difficulty falling asleep and wakes up at 4 am most morning but able to fall back asleep. Sleeping on average about 6 hours and feels tired. She also has anxiety, takes Lorazepam very seldom. She does not want medication at this time  Vitamin D Deficiency: taking supplementation   Hypothyroidism: she is taking Synthroid as prescribed, she denies dry skin or constipation, no difficulty swallowing.   Patient Active Problem List   Diagnosis Date Noted  . Anterior knee pain 01/10/2015  . Symptomatic menopausal or female climacteric states 01/10/2015  . Otosclerosis of both ears 01/10/2015  . Allergic rhinitis 01/10/2015  . Hypothyroidism, adult 01/04/2015  . Dyslipidemia 01/04/2015  . Overweight (BMI 25.0-29.9) 01/04/2015  . Vitamin D deficiency 01/04/2015  . B12 deficiency 01/04/2015  . Left anterior knee pain  01/04/2015  . Bilateral hearing loss 01/04/2015  . Anxiety disorder 01/04/2015  . Personal history of healed traumatic fracture 01/29/2013    Past Surgical History:  Procedure Laterality Date  . EXTERNAL EAR SURGERY Bilateral   . KNEE SURGERY Right     Family History  Problem Relation Age of Onset  . Hypertension Mother   . Alcohol abuse Father   . Cancer Father     Colon    Social History   Social History  . Marital status: Married    Spouse name: Laveda Abbe   . Number of children: N/A  . 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 Smoking status: Former Smoker    Quit date: 04/24/1993  . Smokeless tobacco: Never Used  . Alcohol use No  . Drug use: No  . Sexual activity: Not Currently   Other Topics Concern  . Not on file   Social History Narrative  . No narrative on file     Current Outpatient Prescriptions:  .  aspirin EC 81 MG tablet, Take 1 tablet (81 mg total) by mouth daily., Disp: 30 tablet, Rfl: 0 .  Cholecalciferol (VITAMIN D HIGH POTENCY) 1000 UNITS capsule, Take 1 tablet by mouth daily., Disp: , Rfl:  .  LORazepam (ATIVAN) 1 MG tablet, Take 1 tablet by mouth as needed., Disp: , Rfl:  .  Methylcobalamin (B12-ACTIVE) 1  MG CHEW, Chew 1 tablet by mouth daily., Disp: , Rfl:  .  SYNTHROID 100 MCG tablet, Take 1 tablet by mouth  Monday through Saturday and 1 and 1/2 tablets by mouth  on Sunday, Disp: 96 tablet, Rfl: 1  No Known Allergies   ROS  Constitutional: Negative for fever or weight change.  Respiratory: Negative for cough and shortness of breath.   Cardiovascular: Negative for chest pain or palpitations.  Gastrointestinal: Negative for abdominal pain, no bowel changes.  Musculoskeletal: Negative for gait problem or joint swelling.  Skin: Negative for rash.  Neurological: Negative for dizziness or headache.  No other specific complaints in a complete review of systems (except as listed in HPI  above).  Objective  Vitals:   12/22/15 1446  BP: 98/64  Pulse: 78  Resp: 16  Temp: 97.9 F (36.6 C)  TempSrc: Oral  SpO2: 95%  Weight: 158 lb 14.4 oz (72.1 kg)  Height: 5' 2.5" (1.588 m)    Body mass index is 28.6 kg/m.  Physical Exam  Constitutional: Patient appears well-developed and well-nourished. Overweight . No distress.  HEENT: head atraumatic, normocephalic, pupils equal and reactive to light,  neck supple, throat within normal limits Cardiovascular: Normal rate, regular rhythm and normal heart sounds.  No murmur heard. No BLE edema. Pulmonary/Chest: Effort normal and breath sounds normal. No respiratory distress. Abdominal: Soft.  There is no tenderness. Psychiatric: Patient has a normal mood and affect. behavior is normal. Judgment and thought content normal.   Recent Results (from the past 2160 hour(s))  Lipid panel     Status: Abnormal   Collection Time: 09/28/15  7:31 AM  Result Value Ref Range   Cholesterol, Total 246 (H) 100 - 199 mg/dL   Triglycerides 86 0 - 149 mg/dL   HDL 69 >39 mg/dL   VLDL Cholesterol Cal 17 5 - 40 mg/dL   LDL Calculated 160 (H) 0 - 99 mg/dL   Chol/HDL Ratio 3.6 0.0 - 4.4 ratio units    Comment:                                   T. Chol/HDL Ratio                                             Men  Women                               1/2 Avg.Risk  3.4    3.3                                   Avg.Risk  5.0    4.4                                2X Avg.Risk  9.6    7.1                                3X Avg.Risk 23.4   11.0       PHQ2/9: Depression screen [facility] 2/9 12/22/2015 07/29/2015 06/22/2015 01/11/2015  Decreased Interest 0 0 0 0  Down, Depressed, Hopeless 0 0 0 0  PHQ - 2 Score 0 0 0 0    Fall Risk: Fall Risk  12/22/2015 07/29/2015 06/22/2015 01/11/2015  Falls in the past year? No No No No    Functional Status Survey: Is the patient deaf or have difficulty hearing?: No Does the patient have difficulty seeing, even when wearing  glasses/contacts?: Yes Does the patient have difficulty concentrating, remembering, or making decisions?: No Does the patient have difficulty walking or climbing stairs?: No Does the patient have difficulty dressing or bathing?: No   Assessment & Plan  1. Dyslipidemia  - Lipid panel - Comprehensive metabolic panel  2. Hypothyroidism, adult  - SYNTHROID 100 MCG tablet; Take 1 tablet by mouth  Monday through Saturday and 1 and 1/2 tablets by mouth  on Sunday  Dispense: 96 tablet; Refill: 1 - TSH  3. B12 deficiency  - Vitamin B12  4. Vitamin D deficiency  - VITAMIN D 25 Hydroxy (Vit-D Deficiency, Fractures)  5. Diabetes mellitus screening  - Hemoglobin A1c  6. Other fatigue  - Comprehensive metabolic panel - CBC with Differential/Platelet

## 2015-12-27 ENCOUNTER — Encounter: Payer: Self-pay | Admitting: Family Medicine

## 2016-01-11 LAB — CBC WITH DIFFERENTIAL/PLATELET
BASOS ABS: 0 10*3/uL (ref 0.0–0.2)
Basos: 0 %
EOS (ABSOLUTE): 0.1 10*3/uL (ref 0.0–0.4)
Eos: 2 %
HEMATOCRIT: 39 % (ref 34.0–46.6)
HEMOGLOBIN: 13 g/dL (ref 11.1–15.9)
Immature Grans (Abs): 0 10*3/uL (ref 0.0–0.1)
Immature Granulocytes: 0 %
LYMPHS ABS: 1.4 10*3/uL (ref 0.7–3.1)
Lymphs: 36 %
MCH: 31.2 pg (ref 26.6–33.0)
MCHC: 33.3 g/dL (ref 31.5–35.7)
MCV: 94 fL (ref 79–97)
MONOCYTES: 8 %
MONOS ABS: 0.3 10*3/uL (ref 0.1–0.9)
NEUTROS ABS: 2 10*3/uL (ref 1.4–7.0)
Neutrophils: 54 %
Platelets: 217 10*3/uL (ref 150–379)
RBC: 4.17 x10E6/uL (ref 3.77–5.28)
RDW: 13.6 % (ref 12.3–15.4)
WBC: 3.7 10*3/uL (ref 3.4–10.8)

## 2016-01-11 LAB — COMPREHENSIVE METABOLIC PANEL
A/G RATIO: 2.2 (ref 1.2–2.2)
ALK PHOS: 66 IU/L (ref 39–117)
ALT: 13 IU/L (ref 0–32)
AST: 20 IU/L (ref 0–40)
Albumin: 4.6 g/dL (ref 3.5–5.5)
BILIRUBIN TOTAL: 0.4 mg/dL (ref 0.0–1.2)
BUN/Creatinine Ratio: 19 (ref 9–23)
BUN: 13 mg/dL (ref 6–24)
CALCIUM: 9.6 mg/dL (ref 8.7–10.2)
CHLORIDE: 102 mmol/L (ref 96–106)
CO2: 24 mmol/L (ref 18–29)
Creatinine, Ser: 0.69 mg/dL (ref 0.57–1.00)
GFR calc Af Amer: 114 mL/min/{1.73_m2} (ref >59)
GFR calc non Af Amer: 99 mL/min/{1.73_m2} (ref >59)
Globulin, Total: 2.1 g/dL (ref 1.5–4.5)
Glucose: 92 mg/dL (ref 65–99)
POTASSIUM: 4.4 mmol/L (ref 3.5–5.2)
Sodium: 141 mmol/L (ref 134–144)
TOTAL PROTEIN: 6.7 g/dL (ref 6.0–8.5)

## 2016-01-11 LAB — LIPID PANEL
CHOLESTEROL TOTAL: 249 mg/dL — AB (ref 100–199)
Chol/HDL Ratio: 4.2 ratio units (ref 0.0–4.4)
HDL: 60 mg/dL (ref >39)
LDL Calculated: 156 mg/dL — ABNORMAL HIGH (ref 0–99)
Triglycerides: 164 mg/dL — ABNORMAL HIGH (ref 0–149)
VLDL CHOLESTEROL CAL: 33 mg/dL (ref 5–40)

## 2016-01-11 LAB — HEMOGLOBIN A1C
ESTIMATED AVERAGE GLUCOSE: 100 mg/dL
Hgb A1c MFr Bld: 5.1 % (ref 4.8–5.6)

## 2016-01-11 LAB — VITAMIN D 25 HYDROXY (VIT D DEFICIENCY, FRACTURES): VIT D 25 HYDROXY: 26.6 ng/mL — AB (ref 30.0–100.0)

## 2016-01-11 LAB — VITAMIN B12: VITAMIN B 12: 798 pg/mL (ref 211–946)

## 2016-01-11 LAB — TSH: TSH: 1.06 u[IU]/mL (ref 0.450–4.500)

## 2016-01-13 ENCOUNTER — Encounter: Payer: Self-pay | Admitting: Family Medicine

## 2016-01-13 ENCOUNTER — Other Ambulatory Visit: Payer: Self-pay | Admitting: Family Medicine

## 2016-01-13 ENCOUNTER — Ambulatory Visit (INDEPENDENT_AMBULATORY_CARE_PROVIDER_SITE_OTHER): Payer: 59 | Admitting: Family Medicine

## 2016-01-13 VITALS — BP 112/62 | HR 82 | Temp 98.2°F | Resp 16 | Ht 63.0 in | Wt 156.9 lb

## 2016-01-13 DIAGNOSIS — Z0000 Encounter for general adult medical examination without abnormal findings: Secondary | ICD-10-CM

## 2016-01-13 DIAGNOSIS — Z124 Encounter for screening for malignant neoplasm of cervix: Secondary | ICD-10-CM

## 2016-01-13 DIAGNOSIS — Z01419 Encounter for gynecological examination (general) (routine) without abnormal findings: Secondary | ICD-10-CM

## 2016-01-13 MED ORDER — LORAZEPAM 1 MG PO TABS: 1.0000 mg | ORAL_TABLET | ORAL | 0 refills | Status: DC | PRN

## 2016-01-13 NOTE — Progress Notes (Signed)
Name: Taylor Gilmore   MRN: [id number]    DOB: [birth year]   Date:01/13/2016       Progress Note  Subjective  Chief Complaint  Chief Complaint  Patient presents with  . Annual Exam    HPI  Well Woman: not sexually active, she is post-menopausal, explained importance of returning in case she has post-menopausal bleeding. No breast lumps, pap smear is up to date but she would like to have it done for work. Reviewed labs with patient today. Mammogram already scheduled. Colonoscopy up to date. She will get flu shot at work  Patient Active Problem List   Diagnosis Date Noted  . Anterior knee pain 01/10/2015  . Symptomatic menopausal or female climacteric states 01/10/2015  . Otosclerosis of both ears 01/10/2015  . Allergic rhinitis 01/10/2015  . Hypothyroidism, adult 01/04/2015  . Dyslipidemia 01/04/2015  . Overweight (BMI 25.0-29.9) 01/04/2015  . Vitamin D deficiency 01/04/2015  . B12 deficiency 01/04/2015  . Left anterior knee pain 01/04/2015  . Bilateral hearing loss 01/04/2015  . Anxiety disorder 01/04/2015  . Personal history of healed traumatic fracture 01/29/2013    Past Surgical History:  Procedure Laterality Date  . EXTERNAL EAR SURGERY Bilateral   . KNEE SURGERY Right     Family History  Problem Relation Age of Onset  . Hypertension Mother   . Alcohol abuse Father   . Cancer Father     Colon    Social History   Social History  . Marital status: Married    Spouse name: Laveda Abbe   . Number of children: N/A  . 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 Smoking status: Former Smoker    Quit date: 04/24/1993  . Smokeless tobacco: Never Used  . Alcohol use No  . Drug use: No  . Sexual activity: Not Currently   Other Topics Concern  . Not on file   Social History Narrative  . No narrative on file     Current Outpatient Prescriptions:  .  aspirin EC 81 MG tablet, Take 1 tablet (81 mg total)  by mouth daily., Disp: 30 tablet, Rfl: 0 .  Cholecalciferol (VITAMIN D HIGH POTENCY) 1000 UNITS capsule, Take 1 tablet by mouth daily., Disp: , Rfl:  .  LORazepam (ATIVAN) 1 MG tablet, Take 1 tablet by mouth as needed., Disp: , Rfl:  .  Methylcobalamin (B12-ACTIVE) 1 MG CHEW, Chew 1 tablet by mouth daily., Disp: , Rfl:  .  SYNTHROID 100 MCG tablet, Take 1 tablet by mouth  Monday through Saturday and 1 and 1/2 tablets by mouth  on Sunday, Disp: 96 tablet, Rfl: 1  No Known Allergies   ROS  Constitutional: Negative for fever or weight change.  Respiratory: Negative for cough and shortness of breath.   Cardiovascular: Negative for chest pain or palpitations.  Gastrointestinal: Negative for abdominal pain, no bowel changes.  Musculoskeletal: Negative for gait problem or joint swelling.  Skin: Negative for rash.  Neurological: Negative for dizziness or headache.  No other specific complaints in a complete review of systems (except as listed in HPI above).  Objective  Vitals:   01/13/16 0823  BP: 112/62  Pulse: 82  Resp: 16  Temp: 98.2 F (36.8 C)  TempSrc: Oral  SpO2: 96%  Weight: 156 lb 14.4 oz (71.2 kg)  Height: 5\' 3"  (1.6 m)    Body mass index is 27.79 kg/m.  Physical Exam  Constitutional: Patient appears well-developed and well-nourished. No distress.  HENT: Head: Normocephalic and atraumatic. Ears: B TMs ok, no erythema or effusion; Nose: Nose normal. Mouth/Throat: Oropharynx is clear and moist. No oropharyngeal exudate.  Eyes: Conjunctivae and EOM are normal. Pupils are equal, round, and reactive to light. No scleral icterus.  Neck: Normal range of motion. Neck supple. No JVD present. No thyromegaly present.  Cardiovascular: Normal rate, regular rhythm and normal heart sounds.  No murmur heard. No BLE edema. Pulmonary/Chest: Effort normal and breath sounds normal. No respiratory distress. Abdominal: Soft. Bowel sounds are normal, no distension. There is no tenderness. no  masses Breast: no lumps or masses, no nipple discharge or rashes FEMALE GENITALIA:  External genitalia normal External urethra normal Vaginal vault normal without discharge or lesions Cervix normal without discharge or lesions Bimanual exam normal without masses RECTAL: not done Musculoskeletal: Normal range of motion, no joint effusions. No gross deformities Neurological: he is alert and oriented to person, place, and time. No cranial nerve deficit. Coordination, balance, strength, speech and gait are normal.  Skin: Skin is warm and dry. No rash noted. No erythema.  Psychiatric: Patient has a normal mood and affect. behavior is normal. Judgment and thought content normal.   Recent Results (from the past 2160 hour(s))  Lipid panel     Status: Abnormal   Collection Time: 01/10/16  8:06 AM  Result Value Ref Range   Cholesterol, Total 249 (H) 100 - 199 mg/dL   Triglycerides 164 (H) 0 - 149 mg/dL   HDL 60 >39 mg/dL   VLDL Cholesterol Cal 33 5 - 40 mg/dL   LDL Calculated 156 (H) 0 - 99 mg/dL   Chol/HDL Ratio 4.2 0.0 - 4.4 ratio units    Comment:                                   T. Chol/HDL Ratio                                             Men  Women                               1/2 Avg.Risk  3.4    3.3                                   Avg.Risk  5.0    4.4                                2X Avg.Risk  9.6    7.1                                3X Avg.Risk 23.4   11.0   Comprehensive metabolic panel     Status: None   Collection Time: 01/10/16  8:06 AM  Result Value Ref Range   Glucose 92 65 - 99 mg/dL   BUN 13 6 - 24 mg/dL   Creatinine, Ser 0.69 0.57 - 1.00 mg/dL   GFR calc non Af Amer 99 >59 mL/min/1.73  GFR calc Af Amer 114 >59 mL/min/1.73   BUN/Creatinine Ratio 19 9 - 23   Sodium 141 134 - 144 mmol/L   Potassium 4.4 3.5 - 5.2 mmol/L   Chloride 102 96 - 106 mmol/L   CO2 24 18 - 29 mmol/L   Calcium 9.6 8.7 - 10.2 mg/dL   Total Protein 6.7 6.0 - 8.5 g/dL   Albumin 4.6 3.5 -  5.5 g/dL   Globulin, Total 2.1 1.5 - 4.5 g/dL   Albumin/Globulin Ratio 2.2 1.2 - 2.2   Bilirubin Total 0.4 0.0 - 1.2 mg/dL   Alkaline Phosphatase 66 39 - 117 IU/L   AST 20 0 - 40 IU/L   ALT 13 0 - 32 IU/L  TSH     Status: None   Collection Time: 01/10/16  8:06 AM  Result Value Ref Range   TSH 1.060 0.450 - 4.500 uIU/mL  Vitamin B12     Status: None   Collection Time: 01/10/16  8:06 AM  Result Value Ref Range   Vitamin B-12 798 211 - 946 pg/mL  VITAMIN D 25 Hydroxy (Vit-D Deficiency, Fractures)     Status: Abnormal   Collection Time: 01/10/16  8:06 AM  Result Value Ref Range   Vit D, 25-Hydroxy 26.6 (L) 30.0 - 100.0 ng/mL    Comment: Vitamin D deficiency has been defined by the Institute of Medicine and an Endocrine Society practice guideline as a level of serum 25-OH vitamin D less than 20 ng/mL (1,2). The Endocrine Society went on to further define vitamin D insufficiency as a level between 21 and 29 ng/mL (2). 1. IOM (Institute of Medicine). 2010. Dietary reference    intakes for calcium and D. [place]: The    [employer]. 2. Holick MF, Binkley [place], Bischoff-Ferrari HA, et al.    Evaluation, treatment, and prevention of vitamin D    deficiency: an Endocrine Society clinical practice    guideline. JCEM. 2011 Jul; 96(7):1911-30.   CBC with Differential/Platelet     Status: None   Collection Time: 01/10/16  8:06 AM  Result Value Ref Range   WBC 3.7 3.4 - 10.8 x10E3/uL   RBC 4.17 3.77 - 5.28 x10E6/uL   Hemoglobin 13.0 11.1 - 15.9 g/dL   Hematocrit 39.0 34.0 - 46.6 %   MCV 94 79 - 97 fL   MCH 31.2 26.6 - 33.0 pg   MCHC 33.3 31.5 - 35.7 g/dL   RDW 13.6 12.3 - 15.4 %   Platelets 217 150 - 379 x10E3/uL   Neutrophils 54 %   Lymphs 36 %   Monocytes 8 %   Eos 2 %   Basos 0 %   Neutrophils Absolute 2.0 1.4 - 7.0 x10E3/uL   Lymphocytes Absolute 1.4 0.7 - 3.1 x10E3/uL   Monocytes Absolute 0.3 0.1 - 0.9 x10E3/uL   EOS (ABSOLUTE) 0.1 0.0 - 0.4 x10E3/uL    Basophils Absolute 0.0 0.0 - 0.2 x10E3/uL   Immature Granulocytes 0 %   Immature Grans (Abs) 0.0 0.0 - 0.1 x10E3/uL  Hemoglobin A1c     Status: None   Collection Time: 01/10/16  8:06 AM  Result Value Ref Range   Hgb A1c MFr Bld 5.1 4.8 - 5.6 %    Comment:          Pre-diabetes: 5.7 - 6.4          Diabetes: >6.4          Glycemic control for adults with diabetes: <7.0  Est. average glucose Bld gHb Est-mCnc 100 mg/dL     PHQ2/9: Depression screen [facility] 2/9 12/22/2015 07/29/2015 06/22/2015 01/11/2015  Decreased Interest 0 0 0 0  Down, Depressed, Hopeless 0 0 0 0  PHQ - 2 Score 0 0 0 0     Fall Risk: Fall Risk  12/22/2015 07/29/2015 06/22/2015 01/11/2015  Falls in the past year? No No No No    Assessment & Plan  1. Well woman exam  Discussed importance of 150 minutes of physical activity weekly, eat two servings of fish weekly, eat one serving of tree nuts ( cashews, pistachios, pecans, almonds.Marland Kitchen) every other day, eat 6 servings of fruit/vegetables daily and drink plenty of water and avoid sweet beverages.   2. Cervical cancer screening  - PapLb, HPV, rfx16/18

## 2016-01-20 ENCOUNTER — Telehealth: Payer: Self-pay | Admitting: Family Medicine

## 2016-01-20 NOTE — Telephone Encounter (Signed)
Attempted to contact Taylor Gilmore at [facility]

## 2016-01-20 NOTE — Telephone Encounter (Signed)
Kim from [facility] states that there was no name of the valve and she will be sending it back today. States that you can resubmit it once you put all the information on it.

## 2016-02-03 ENCOUNTER — Ambulatory Visit
Admission: RE | Admit: 2016-02-03 | Discharge: 2016-02-03 | Disposition: A | Discharge status: Home / Self Care | Payer: 59 | Source: Ambulatory Visit | Attending: Family Medicine | Admitting: Family Medicine

## 2016-02-03 ENCOUNTER — Other Ambulatory Visit: Payer: Self-pay | Admitting: Family Medicine

## 2016-02-03 DIAGNOSIS — Z1231 Encounter for screening mammogram for malignant neoplasm of breast: Secondary | ICD-10-CM

## 2016-02-03 LAB — HM MAMMOGRAPHY: HM Mammogram: NORMAL (ref 0–4)

## 2016-02-04 LAB — GYN REPORT: PAP Smear Comment: 0

## 2016-02-04 LAB — SPECIMEN STATUS REPORT

## 2016-02-05 LAB — PAPLB, HPV, RFX16/18
HPV, high-risk: NEGATIVE
PAP Smear Comment: 0

## 2016-07-13 ENCOUNTER — Encounter: Payer: Self-pay | Admitting: Family Medicine

## 2016-07-13 ENCOUNTER — Ambulatory Visit (INDEPENDENT_AMBULATORY_CARE_PROVIDER_SITE_OTHER): Payer: 59 | Admitting: Family Medicine

## 2016-07-13 VITALS — BP 114/78 | HR 89 | Temp 97.5°F | Resp 16 | Ht 63.0 in | Wt 156.6 lb

## 2016-07-13 DIAGNOSIS — E039 Hypothyroidism, unspecified: Secondary | ICD-10-CM

## 2016-07-13 DIAGNOSIS — H8093 Unspecified otosclerosis, bilateral: Secondary | ICD-10-CM

## 2016-07-13 DIAGNOSIS — E663 Overweight: Secondary | ICD-10-CM

## 2016-07-13 DIAGNOSIS — G4700 Insomnia, unspecified: Secondary | ICD-10-CM

## 2016-07-13 DIAGNOSIS — E538 Deficiency of other specified B group vitamins: Secondary | ICD-10-CM

## 2016-07-13 DIAGNOSIS — E785 Hyperlipidemia, unspecified: Secondary | ICD-10-CM

## 2016-07-13 DIAGNOSIS — H900 Conductive hearing loss, bilateral: Secondary | ICD-10-CM

## 2016-07-13 DIAGNOSIS — E559 Vitamin D deficiency, unspecified: Secondary | ICD-10-CM

## 2016-07-13 MED ORDER — LORAZEPAM 1 MG PO TABS: 1.0000 mg | ORAL_TABLET | ORAL | 0 refills | Status: DC | PRN

## 2016-07-13 NOTE — Progress Notes (Signed)
Name: Taylor Gilmore   MRN: [id number]    DOB: 28-Jun-1960   Date:07/13/2016       Progress Note  Subjective  Chief Complaint  Chief Complaint  Patient presents with  . Hyperlipidemia    6 month follow up    HPI  Overweight: she developed obesity after child bearing age. Her maximum was 180 lbs around 2014. She has lost 30 lbs over the past few years. She has changed her diet, avoiding junk food, avoids eating out, no sweet beverages, drinks mostly water, cutting down on carbohydrates. Joined a gym, exercises on average 4 classes week. She is frustrated because she has been unable to pass the 150 lbs mark. She states her goal weight is 140 lbs. She has difficulty logging her calorie intake.   B12 deficiency: explained that she can take Biotin with B12. Last level was normal, she would like to have labs rechecked it.   Dyslipidemia: she used to take statins, but has been off since life style modification with weight loss. She wants to have labs rechecked today   Insomnia: she states she has difficulty falling asleep at times when she feels anxious, takes Lorazepam very seldom, she would like a new prescription today.   Vitamin D Deficiency: taking supplementation   Hypothyroidism: she is taking Synthroid as prescribed, she denies dry skin or constipation, no difficulty swallowing, no constipation.   Hearing loss: secondary to multiple ear infections as a child.    Patient Active Problem List   Diagnosis Date Noted  . Anterior knee pain 01/10/2015  . Symptomatic menopausal or female climacteric states 01/10/2015  . Otosclerosis of both ears 01/10/2015  . Allergic rhinitis 01/10/2015  . Hypothyroidism, adult 01/04/2015  . Dyslipidemia 01/04/2015  . Overweight (BMI 25.0-29.9) 01/04/2015  . Vitamin D deficiency 01/04/2015  . B12 deficiency 01/04/2015  . Left anterior knee pain 01/04/2015  . Bilateral hearing loss 01/04/2015  . Anxiety disorder 01/04/2015  . Personal  history of healed traumatic fracture 01/29/2013    Past Surgical History:  Procedure Laterality Date  . EXTERNAL EAR SURGERY Bilateral   . KNEE SURGERY Right     Family History  Problem Relation Age of Onset  . Hypertension Mother   . Alcohol abuse Father   . Cancer Father     Colon    Social History   Social History  . Marital status: Married    Spouse name: Laveda Abbe   . Number of children: N/A  . 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 Smoking status: Former Smoker    Quit date: 04/24/1993  . Smokeless tobacco: Never Used  . Alcohol use No  . Drug use: No  . Sexual activity: Not Currently   Other Topics Concern  . Not on file   Social History Narrative  . No narrative on file     Current Outpatient Prescriptions:  .  aspirin EC 81 MG tablet, Take 1 tablet (81 mg total) by mouth daily., Disp: 30 tablet, Rfl: 0 .  Cholecalciferol (VITAMIN D HIGH POTENCY) 1000 UNITS capsule, Take 1 tablet by mouth daily., Disp: , Rfl:  .  LORazepam (ATIVAN) 1 MG tablet, Take 1 tablet (1 mg total) by mouth as needed., Disp: 10 tablet, Rfl: 0 .  Methylcobalamin (B12-ACTIVE) 1 MG CHEW, Chew 1 tablet by mouth daily., Disp: , Rfl:  .  SYNTHROID 100 MCG tablet, Take 1 tablet by  mouth  Monday through Saturday and 1 and 1/2 tablets by mouth  on Sunday, Disp: 96 tablet, Rfl: 1  No Known Allergies   ROS  Constitutional: Negative for fever or weight change.  Respiratory: Negative for cough and shortness of breath.   Cardiovascular: Negative for chest pain or palpitations.  Gastrointestinal: Negative for abdominal pain, no bowel changes.  Musculoskeletal: Negative for gait problem or joint swelling.  Skin: Negative for rash.  Neurological: Negative for dizziness or headache.  No other specific complaints in a complete review of systems (except as listed in HPI above).  Objective  Vitals:   07/13/16 0810  BP: 114/78  Pulse:  89  Resp: 16  Temp: 97.5 F (36.4 C)  SpO2: 96%  Weight: 156 lb 9 oz (71 kg)  Height: 5\' 3"  (1.6 m)    Body mass index is 27.73 kg/m.  Physical Exam  Constitutional: Patient appears well-developed and well-nourished. Obese  No distress.  HEENT: head atraumatic, normocephalic, pupils equal and reactive to light, ears scar on both TM. neck supple, throat within normal limits, normal TM Cardiovascular: Normal rate, regular rhythm and normal heart sounds.  No murmur heard. No BLE edema. Pulmonary/Chest: Effort normal and breath sounds normal. No respiratory distress. Abdominal: Soft.  There is no tenderness. Psychiatric: Patient has a normal mood and affect. behavior is normal. Judgment and thought content normal.   PHQ2/9: Depression screen [facility] 2/9 07/13/2016 12/22/2015 07/29/2015 06/22/2015 01/11/2015  Decreased Interest 0 0 0 0 0  Down, Depressed, Hopeless 0 0 0 0 0  PHQ - 2 Score 0 0 0 0 0     Fall Risk: Fall Risk  07/13/2016 12/22/2015 07/29/2015 06/22/2015 01/11/2015  Falls in the past year? No No No No No     Functional Status Survey: Is the patient deaf or have difficulty hearing?: No Does the patient have difficulty seeing, even when wearing glasses/contacts?: No Does the patient have difficulty concentrating, remembering, or making decisions?: No Does the patient have difficulty walking or climbing stairs?: No Does the patient have difficulty dressing or bathing?: No Does the patient have difficulty doing errands alone such as visiting a doctor's office or shopping?: No    Assessment & Plan  1. Dyslipidemia  - Lipid panel - Comp. Metabolic Panel (12)  2. Hypothyroidism, adult  -TSH  3. B12 deficiency  - Vitamin B12  4. Vitamin D deficiency  - VITAMIN D 25 Hydroxy (Vit-D Deficiency, Fractures)  5. Overweight (BMI 25.0-29.9)  - Insulin, 2 Hour - Hemoglobin A1c  6. Insomnia, unspecified type  She takes it very seldom, never got last prescription but no  longer valid, she takes prn for sleep when anxious before bedtime - LORazepam (ATIVAN) 1 MG tablet; Take 1 tablet (1 mg total) by mouth as needed.  Dispense: 10 tablet; Refill: 0

## 2016-07-17 LAB — COMP. METABOLIC PANEL (12)
A/G RATIO: 2.1 (ref 1.2–2.2)
ALBUMIN: 4.9 g/dL (ref 3.5–5.5)
AST: 28 IU/L (ref 0–40)
Alkaline Phosphatase: 68 IU/L (ref 39–117)
BILIRUBIN TOTAL: 0.4 mg/dL (ref 0.0–1.2)
BUN / CREAT RATIO: 19 (ref 9–23)
BUN: 15 mg/dL (ref 6–24)
CHLORIDE: 103 mmol/L (ref 96–106)
Calcium: 10.1 mg/dL (ref 8.7–10.2)
Creatinine, Ser: 0.78 mg/dL (ref 0.57–1.00)
GFR calc Af Amer: 100 mL/min/{1.73_m2} (ref >59)
GFR calc non Af Amer: 86 mL/min/{1.73_m2} (ref >59)
GLOBULIN, TOTAL: 2.3 g/dL (ref 1.5–4.5)
Glucose: 91 mg/dL (ref 65–99)
POTASSIUM: 4.8 mmol/L (ref 3.5–5.2)
SODIUM: 145 mmol/L — AB (ref 134–144)
TOTAL PROTEIN: 7.2 g/dL (ref 6.0–8.5)

## 2016-07-17 LAB — LIPID PANEL
Chol/HDL Ratio: 4 ratio units (ref 0.0–4.4)
Cholesterol, Total: 275 mg/dL — ABNORMAL HIGH (ref 100–199)
HDL: 68 mg/dL (ref >39)
LDL Calculated: 191 mg/dL — ABNORMAL HIGH (ref 0–99)
TRIGLYCERIDES: 80 mg/dL (ref 0–149)
VLDL CHOLESTEROL CAL: 16 mg/dL (ref 5–40)

## 2016-07-17 LAB — HEMOGLOBIN A1C
Est. average glucose Bld gHb Est-mCnc: 103 mg/dL
Hgb A1c MFr Bld: 5.2 % (ref 4.8–5.6)

## 2016-07-17 LAB — VITAMIN B12: Vitamin B-12: >2000 pg/mL — ABNORMAL HIGH (ref 232–1245)

## 2016-07-17 LAB — INSULIN, 2 HOUR

## 2016-07-17 LAB — VITAMIN D 25 HYDROXY (VIT D DEFICIENCY, FRACTURES): VIT D 25 HYDROXY: 35.5 ng/mL (ref 30.0–100.0)

## 2016-07-17 LAB — TSH: TSH: 2.16 u[IU]/mL (ref 0.450–4.500)

## 2016-07-21 ENCOUNTER — Other Ambulatory Visit: Payer: Self-pay | Admitting: Family Medicine

## 2016-07-21 DIAGNOSIS — E785 Hyperlipidemia, unspecified: Secondary | ICD-10-CM

## 2016-07-21 DIAGNOSIS — E039 Hypothyroidism, unspecified: Secondary | ICD-10-CM

## 2016-07-24 NOTE — Telephone Encounter (Signed)
Patient would also like to know if she could be referred to endocrinology because she has had problems with her thyroid and LDL is very high.  Patient also concerned because it seems to always take a week before she gets the results of labs and she would like to start doing her blood work before being seen.   Patient stated that her last labs were done on 07/13/16 and she saw the results in mychart but there was no annotation regarding the abnormal labs.

## 2016-07-24 NOTE — Telephone Encounter (Signed)
Last LDL and TSH reviewed Med refilled Referral entered Left brief message noting above

## 2016-07-24 NOTE — Assessment & Plan Note (Signed)
Appears to be familial; pt requests referral to endo; refer

## 2016-07-24 NOTE — Assessment & Plan Note (Signed)
Refilled med; will refer to endo per her request

## 2016-07-24 NOTE — Telephone Encounter (Signed)
Can you please review labs and see if this is the correct dosage for this patient. Thanks

## 2016-07-25 ENCOUNTER — Telehealth: Payer: Self-pay | Admitting: Family Medicine

## 2016-07-31 ENCOUNTER — Ambulatory Visit (INDEPENDENT_AMBULATORY_CARE_PROVIDER_SITE_OTHER): Payer: 59 | Admitting: Family Medicine

## 2016-07-31 ENCOUNTER — Encounter: Payer: Self-pay | Admitting: Family Medicine

## 2016-07-31 VITALS — BP 110/64 | HR 85 | Temp 97.4°F | Resp 16 | Ht 63.0 in | Wt 161.1 lb

## 2016-07-31 DIAGNOSIS — E785 Hyperlipidemia, unspecified: Secondary | ICD-10-CM | POA: Diagnosis not present

## 2016-07-31 DIAGNOSIS — R748 Abnormal levels of other serum enzymes: Secondary | ICD-10-CM | POA: Diagnosis not present

## 2016-07-31 MED ORDER — ROSUVASTATIN CALCIUM 10 MG PO TABS: 10.0000 mg | ORAL_TABLET | Freq: Every day | ORAL | 3 refills | Status: DC

## 2016-07-31 NOTE — Progress Notes (Signed)
Name: Taylor Gilmore   MRN: [id number]    DOB: 1960/08/29   Date:07/31/2016       Progress Note  Subjective  Chief Complaint  Chief Complaint  Patient presents with  . discuss lab results LDL    has appointment with Endo in May    HPI  Dyslipidemia: she came in because she is concerned about high LDL even though she eats healthy and exercises. She went back on old rx of Simvastatin but is now having aches and pains again, we will try Crestor instead, explained likely hereditary. We will recheck labs in 2 months  Elevated B12: she stopped taking otc supplements and we will recheck in 2 months  Patient Active Problem List   Diagnosis Date Noted  . Anterior knee pain 01/10/2015  . Symptomatic menopausal or female climacteric states 01/10/2015  . Otosclerosis of both ears 01/10/2015  . Allergic rhinitis 01/10/2015  . Hypothyroidism, adult 01/04/2015  . Dyslipidemia 01/04/2015  . Overweight (BMI 25.0-29.9) 01/04/2015  . Vitamin D deficiency 01/04/2015  . B12 deficiency 01/04/2015  . Left anterior knee pain 01/04/2015  . Bilateral hearing loss 01/04/2015  . Anxiety disorder 01/04/2015  . Personal history of healed traumatic fracture 01/29/2013    Past Surgical History:  Procedure Laterality Date  . EXTERNAL EAR SURGERY Bilateral   . KNEE SURGERY Right     Family History  Problem Relation Age of Onset  . Hypertension Mother   . Alcohol abuse Father   . Cancer Father     Colon    Social History   Social History  . Marital status: Married    Spouse name: Laveda Abbe   . Number of children: N/A  . 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 Smoking status: Former Smoker    Quit date: 04/24/1993  . Smokeless tobacco: Never Used  . Alcohol use No  . Drug use: No  . Sexual activity: Not Currently   Other Topics Concern  . Not on file   Social History Narrative  . No narrative on file     Current  Outpatient Prescriptions:  .  aspirin EC 81 MG tablet, Take 1 tablet (81 mg total) by mouth daily., Disp: 30 tablet, Rfl: 0 .  Cholecalciferol (VITAMIN D HIGH POTENCY) 1000 UNITS capsule, Take 1 tablet by mouth daily., Disp: , Rfl:  .  LORazepam (ATIVAN) 1 MG tablet, Take 1 tablet (1 mg total) by mouth as needed., Disp: 10 tablet, Rfl: 0 .  SYNTHROID 100 MCG tablet, TAKE 1 TABLET BY MOUTH  MONDAY THROUGH SATURDAY AND 1 AND 1/2 TABLETS BY MOUTH  ON SUNDAY, Disp: 96 tablet, Rfl: 0 .  Methylcobalamin (B12-ACTIVE) 1 MG CHEW, Chew 1 tablet by mouth daily., Disp: , Rfl:  .  rosuvastatin (CRESTOR) 10 MG tablet, Take 1 tablet (10 mg total) by mouth daily., Disp: 90 tablet, Rfl: 3  No Known Allergies   ROS  Constitutional: Negative for fever or weight change.  Respiratory: Negative for cough and shortness of breath.   Cardiovascular: Negative for chest pain or palpitations.  Gastrointestinal: Negative for abdominal pain, no bowel changes.  Musculoskeletal: Negative for gait problem or joint swelling.  Skin: Negative for rash.  Neurological: Negative for dizziness or headache.  No other specific complaints in a complete review of systems (except as listed in HPI above).  Objective  Vitals:   07/31/16 1509  BP: 110/64  Pulse: 85  Resp: 16  Temp: 97.4 F (36.3 C)  SpO2: 97%  Weight: 161 lb 2 oz (73.1 kg)  Height: 5\' 3"  (1.6 m)    Body mass index is 28.54 kg/m.  Physical Exam  Constitutional: Patient appears well-developed and well-nourished. Obese No distress.  HEENT: head atraumatic, normocephalic, pupils equal and reactive to light, ears scarring present, neck supple, throat within normal limits Cardiovascular: Normal rate, regular rhythm and normal heart sounds.  No murmur heard. No BLE edema. Pulmonary/Chest: Effort normal and breath sounds normal. No respiratory distress. Abdominal: Soft.  There is no tenderness. Psychiatric: Patient has a normal mood and affect. behavior is  normal. Judgment and thought content normal.  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 FH should be suspected when fasting LDL cholesterol is above 189 mg/dL or non-HDL cholesterol is above 219 mg/dL. A family history of high cholesterol and heart disease in 1st degree relatives should be collected. J Clin Lipidol 2011;5:133-140    Chol/HDL Ratio 4.0 0.0 - 4.4 ratio units    Comment:                                   T. Chol/HDL Ratio                                             Men  Women                               1/2 Avg.Risk  3.4    3.3                                   Avg.Risk  5.0    4.4                                2X Avg.Risk  9.6    7.1                                3X Avg.Risk 23.4   11.0   Insulin, 2 Hour     Status: None   Collection Time: 07/13/16  9:02 AM  Result Value Ref Range   Insulin, 2 hour CANCELED uIU/mL    Comment: [facility] was unable to obtain a suitable specimen for the following test(s), and is providing the patient with recollection instructions.  Result canceled by the ancillary   Comp. Metabolic Panel (12)     Status: Abnormal   Collection Time: 07/13/16  9:02 AM  Result Value Ref Range   Glucose 91 65 - 99 mg/dL   BUN 15 6 - 24  mg/dL   Creatinine, Ser 0.78 0.57 - 1.00 mg/dL   GFR calc non Af Amer 86 >59 mL/min/1.73   GFR calc Af Amer 100 >59 mL/min/1.73   BUN/Creatinine Ratio 19 9 - 23   Sodium 145 (H) 134 - 144 mmol/L   Potassium 4.8 3.5 - 5.2 mmol/L   Chloride 103 96 - 106 mmol/L   Calcium 10.1 8.7 - 10.2 mg/dL   Total Protein 7.2 6.0 - 8.5 g/dL   Albumin 4.9 3.5 - 5.5 g/dL   Globulin, Total  2.3 1.5 - 4.5 g/dL   Albumin/Globulin Ratio 2.1 1.2 - 2.2   Bilirubin Total 0.4 0.0 - 1.2 mg/dL   Alkaline Phosphatase 68 39 - 117 IU/L   AST 28 0 - 40 IU/L  VITAMIN D 25 Hydroxy (Vit-D Deficiency, Fractures)     Status: None   Collection Time: 07/13/16  9:02 AM  Result Value Ref Range   Vit D, 25-Hydroxy 35.5 30.0 - 100.0 ng/mL    Comment: Vitamin D deficiency has been defined by the [place] and an Endocrine Society practice guideline as a level of serum 25-OH vitamin D less than 20 ng/mL (1,2). The Endocrine Society went on to further define vitamin D insufficiency as a level between 21 and 29 ng/mL (2). 1. IOM (Institute of Medicine). 2010. Dietary reference    intakes for calcium and D. [place]: The    [employer]. 2. Holick MF, Binkley [place], Bischoff-Ferrari HA, et al.    Evaluation, treatment, and prevention of vitamin D    deficiency: an Endocrine Society clinical practice    guideline. JCEM. 2011 Jul; 96(7):1911-30.   Vitamin B12     Status: Abnormal   Collection Time: 07/13/16  9:02 AM  Result Value Ref Range   Vitamin B-12 >2000 (H) 232 - 1245 pg/mL  Hemoglobin A1c     Status: None   Collection Time: 07/13/16  9:02 AM  Result Value Ref Range   Hgb A1c MFr Bld 5.2 4.8 - 5.6 %    Comment:          Pre-diabetes: 5.7 - 6.4          Diabetes: >6.4          Glycemic control for adults with diabetes: <7.0    Est. average glucose Bld gHb Est-mCnc 103 mg/dL  TSH     Status: None   Collection Time: 07/13/16  9:02 AM  Result Value Ref Range   TSH 2.160 0.450 - 4.500 uIU/mL     PHQ2/9: Depression screen [facility] 2/9 07/31/2016 07/13/2016 12/22/2015 07/29/2015 06/22/2015  Decreased Interest 0 0 0 0 0  Down, Depressed, Hopeless 0 0 0 0 0  PHQ - 2 Score 0 0 0 0 0    Fall Risk: Fall Risk  07/31/2016 07/13/2016 12/22/2015 07/29/2015 06/22/2015  Falls in the past year? No No No No No    Functional Status Survey: Is the patient deaf or have difficulty  hearing?: No Does the patient have difficulty seeing, even when wearing glasses/contacts?: No Does the patient have difficulty concentrating, remembering, or making decisions?: No Does the patient have difficulty walking or climbing stairs?: No Does the patient have difficulty dressing or bathing?: No Does the patient have difficulty doing errands alone such as visiting a doctor's office or shopping?: No   Assessment & Plan  1. Dyslipidemia  - rosuvastatin (CRESTOR) 10 MG tablet; Take 1 tablet (10 mg total) by mouth daily.  Dispense: 90 tablet;  Refill: 3 - Lipid panel  We will stop old rx of Simvastatin and try Crestor start at one a day and if develops pain on joints and muscle can take half every other day, recheck labs in about 2 months   2. Elevated vitamin B12 level  - Vitamin B12

## 2016-09-15 NOTE — Telephone Encounter (Signed)
ERRENOUS °

## 2016-10-06 LAB — LIPID PANEL
CHOL/HDL RATIO: 2.9 ratio (ref 0.0–4.4)
Cholesterol, Total: 187 mg/dL (ref 100–199)
HDL: 65 mg/dL (ref >39)
LDL CALC: 103 mg/dL — AB (ref 0–99)
TRIGLYCERIDES: 93 mg/dL (ref 0–149)
VLDL Cholesterol Cal: 19 mg/dL (ref 5–40)

## 2016-10-06 LAB — VITAMIN B12: Vitamin B-12: 731 pg/mL (ref 232–1245)

## 2016-10-11 ENCOUNTER — Other Ambulatory Visit: Payer: Self-pay | Admitting: Family Medicine

## 2016-10-11 DIAGNOSIS — E039 Hypothyroidism, unspecified: Secondary | ICD-10-CM

## 2016-12-20 ENCOUNTER — Other Ambulatory Visit: Payer: Self-pay | Admitting: Family Medicine

## 2016-12-20 DIAGNOSIS — Z1231 Encounter for screening mammogram for malignant neoplasm of breast: Secondary | ICD-10-CM

## 2017-01-02 ENCOUNTER — Other Ambulatory Visit: Payer: Self-pay | Admitting: Family Medicine

## 2017-01-02 DIAGNOSIS — E039 Hypothyroidism, unspecified: Secondary | ICD-10-CM

## 2017-01-03 NOTE — Telephone Encounter (Signed)
Patient requesting refill of Synthroid to [facility].

## 2017-01-10 ENCOUNTER — Telehealth: Payer: Self-pay | Admitting: Family Medicine

## 2017-01-10 NOTE — Telephone Encounter (Signed)
Pt is scheduled for her annual next week. She would like an order place so she can get her lab work done before the appointment. She would like to pick it up Friday morning. [phone]

## 2017-01-11 ENCOUNTER — Other Ambulatory Visit: Payer: Self-pay | Admitting: Family Medicine

## 2017-01-11 DIAGNOSIS — Z01419 Encounter for gynecological examination (general) (routine) without abnormal findings: Secondary | ICD-10-CM

## 2017-01-11 NOTE — Telephone Encounter (Signed)
Please place order for labs she will need

## 2017-01-12 ENCOUNTER — Other Ambulatory Visit: Payer: Self-pay

## 2017-01-12 DIAGNOSIS — Z01419 Encounter for gynecological examination (general) (routine) without abnormal findings: Secondary | ICD-10-CM

## 2017-01-13 LAB — LIPID PANEL
CHOLESTEROL TOTAL: 176 mg/dL (ref 100–199)
Chol/HDL Ratio: 2.7 ratio (ref 0.0–4.4)
HDL: 66 mg/dL (ref >39)
LDL Calculated: 95 mg/dL (ref 0–99)
Triglycerides: 77 mg/dL (ref 0–149)
VLDL Cholesterol Cal: 15 mg/dL (ref 5–40)

## 2017-01-13 LAB — VITAMIN D 25 HYDROXY (VIT D DEFICIENCY, FRACTURES): Vit D, 25-Hydroxy: 35.8 ng/mL (ref 30.0–100.0)

## 2017-01-13 LAB — COMP. METABOLIC PANEL (12)
ALK PHOS: 65 IU/L (ref 39–117)
AST: 23 IU/L (ref 0–40)
Albumin/Globulin Ratio: 2.3 — ABNORMAL HIGH (ref 1.2–2.2)
Albumin: 4.6 g/dL (ref 3.5–5.5)
BUN/Creatinine Ratio: 13 (ref 9–23)
BUN: 11 mg/dL (ref 6–24)
Bilirubin Total: 0.4 mg/dL (ref 0.0–1.2)
CHLORIDE: 104 mmol/L (ref 96–106)
Calcium: 9.6 mg/dL (ref 8.7–10.2)
Creatinine, Ser: 0.82 mg/dL (ref 0.57–1.00)
GFR calc Af Amer: 93 mL/min/{1.73_m2} (ref >59)
GFR, EST NON AFRICAN AMERICAN: 81 mL/min/{1.73_m2} (ref >59)
GLOBULIN, TOTAL: 2 g/dL (ref 1.5–4.5)
GLUCOSE: 90 mg/dL (ref 65–99)
POTASSIUM: 4.5 mmol/L (ref 3.5–5.2)
Sodium: 143 mmol/L (ref 134–144)
Total Protein: 6.6 g/dL (ref 6.0–8.5)

## 2017-01-13 LAB — VITAMIN B12: VITAMIN B 12: 663 pg/mL (ref 232–1245)

## 2017-01-13 LAB — CBC WITH DIFFERENTIAL/PLATELET
BASOS ABS: 0 10*3/uL (ref 0.0–0.2)
BASOS: 0 %
EOS (ABSOLUTE): 0.1 10*3/uL (ref 0.0–0.4)
Eos: 1 %
Hematocrit: 38.5 % (ref 34.0–46.6)
Hemoglobin: 13 g/dL (ref 11.1–15.9)
IMMATURE GRANS (ABS): 0 10*3/uL (ref 0.0–0.1)
IMMATURE GRANULOCYTES: 0 %
LYMPHS: 36 %
Lymphocytes Absolute: 1.3 10*3/uL (ref 0.7–3.1)
MCH: 31.9 pg (ref 26.6–33.0)
MCHC: 33.8 g/dL (ref 31.5–35.7)
MCV: 94 fL (ref 79–97)
Monocytes Absolute: 0.2 10*3/uL (ref 0.1–0.9)
Monocytes: 7 %
NEUTROS PCT: 56 %
Neutrophils Absolute: 2 10*3/uL (ref 1.4–7.0)
Platelets: 178 10*3/uL (ref 150–379)
RBC: 4.08 x10E6/uL (ref 3.77–5.28)
RDW: 13.5 % (ref 12.3–15.4)
WBC: 3.6 10*3/uL (ref 3.4–10.8)

## 2017-01-13 LAB — HEMOGLOBIN A1C
Est. average glucose Bld gHb Est-mCnc: 103 mg/dL
Hgb A1c MFr Bld: 5.2 % (ref 4.8–5.6)

## 2017-01-13 LAB — TSH: TSH: 3.17 u[IU]/mL (ref 0.450–4.500)

## 2017-01-16 ENCOUNTER — Ambulatory Visit (INDEPENDENT_AMBULATORY_CARE_PROVIDER_SITE_OTHER): Payer: 59 | Admitting: Family Medicine

## 2017-01-16 ENCOUNTER — Encounter: Payer: Self-pay | Admitting: Family Medicine

## 2017-01-16 VITALS — BP 118/64 | HR 74 | Temp 98.5°F | Resp 18 | Ht 63.0 in | Wt 154.6 lb

## 2017-01-16 DIAGNOSIS — R9431 Abnormal electrocardiogram [ECG] [EKG]: Secondary | ICD-10-CM

## 2017-01-16 DIAGNOSIS — Z01419 Encounter for gynecological examination (general) (routine) without abnormal findings: Secondary | ICD-10-CM

## 2017-01-16 DIAGNOSIS — Z23 Encounter for immunization: Secondary | ICD-10-CM

## 2017-01-16 MED ORDER — ROSUVASTATIN CALCIUM 10 MG PO TABS: 10.0000 mg | ORAL_TABLET | Freq: Every day | ORAL | 3 refills | Status: DC

## 2017-01-16 NOTE — Progress Notes (Signed)
Name: Taylor Gilmore   MRN: [id number]    DOB: [birth year]   Date:01/16/2017       Progress Note  Subjective  Chief Complaint  Chief Complaint  Patient presents with  . Annual Exam    HPI   Patient presents for annual CPE   Diet: she is eating healthy, avocados, egg whites for breakfast, lunch grilled chicken and vegetables, vegetable soup for dinner. Lots of fruit and vegetables, on weight watchers since 12/2016 and is doing well Exercise: run twice a week and goes to [facility] for classes twice a week  USPSTF grade A and B recommendations  Depression:  Depression screen [facility] 2/9 01/16/2017 07/31/2016 07/13/2016 12/22/2015 07/29/2015  Decreased Interest 0 0 0 0 0  Down, Depressed, Hopeless 0 0 0 0 0  PHQ - 2 Score 0 0 0 0 0   Hypertension: BP Readings from Last 3 Encounters:  01/16/17 118/64  07/31/16 110/64  07/13/16 114/78   Obesity: Wt Readings from Last 3 Encounters:  01/16/17 154 lb 9.6 oz (70.1 kg)  07/31/16 161 lb 2 oz (73.1 kg)  07/13/16 156 lb 9 oz (71 kg)   BMI Readings from Last 3 Encounters:  01/16/17 27.39 kg/m  07/31/16 28.54 kg/m  07/13/16 27.73 kg/m    Alcohol: does not drink alcohol  Tobacco use: quit over 10 years ago  HIV, hep B, hep C: she is not interested in getting checked, HIV negative in the past STD testing and prevention (chl/gon/syphilis): not high risk, no need, not sexually active Intimate partner violence: denies  Sexual History/Pain during Intercourse: not sexually active Menstrual History/LMP: post-menopausal , she is aware that if she bleeds she needs to contact us.  Incontinence Symptoms: at times urge incontinence, but only is she holds for too long  Advanced Care Planning: A voluntary discussion about advance care planning including the explanation and discussion of advance directives.  Discussed health care proxy and Living will, and the patient was able to identify a health care proxy as her son: Saranda Legrande.  Patient does not  have a living will at present time.  Breast cancer:  HM Mammogram  Date Value Ref Range Status  01/26/2014 Normal  Final    Last one 02/03/2016  Cervical cancer screening: normal 2017, low risk repeat in 2020  Osteoporosis:  No results found for: HMDEXASCAN  Discussed pros and cons, she will check with insurance before we order it  Fall prevention/vitamin D: no falls, very active, taking vitamin D supplementation Lipids:  Lab Results  Component Value Date   CHOL 176 01/12/2017   CHOL 187 10/05/2016   CHOL 275 (H) 07/13/2016   Lab Results  Component Value Date   HDL 66 01/12/2017   HDL 65 10/05/2016   HDL 68 07/13/2016   Lab Results  Component Value Date   LDLCALC 95 01/12/2017   LDLCALC 103 (H) 10/05/2016   LDLCALC 191 (H) 07/13/2016   Lab Results  Component Value Date   TRIG 77 01/12/2017   TRIG 93 10/05/2016   TRIG 80 07/13/2016   Lab Results  Component Value Date   CHOLHDL 2.7 01/12/2017   CHOLHDL 2.9 10/05/2016   CHOLHDL 4.0 07/13/2016   No results found for: LDLDIRECT  Glucose:  Glucose  Date Value Ref Range Status  01/12/2017 90 65 - 99 mg/dL Final  07/13/2016 91 65 - 99 mg/dL Final  01/10/2016 92 65 - 99 mg/dL Final    Skin cancer:  Colorectal cancer: 03/13/2014  Lung cancer: Low Dose CT Chest recommended if Age 59-80 years, 30 pack-year currently smoking OR have quit w/in 15years. Patient does not qualify.   Aspirin: taking it daily  ECG:01/16/2017   Patient Active Problem List   Diagnosis Date Noted  . Anterior knee pain 01/10/2015  . Symptomatic menopausal or female climacteric states 01/10/2015  . Otosclerosis of both ears 01/10/2015  . Allergic rhinitis 01/10/2015  . Hypothyroidism, adult 01/04/2015  . Dyslipidemia 01/04/2015  . Overweight (BMI 25.0-29.9) 01/04/2015  . Vitamin D deficiency 01/04/2015  . B12 deficiency 01/04/2015  . Left anterior knee pain 01/04/2015  . Bilateral hearing loss 01/04/2015  . Anxiety disorder  01/04/2015  . Personal history of healed traumatic fracture 01/29/2013    Past Surgical History:  Procedure Laterality Date  . EXTERNAL EAR SURGERY Right    6th grade  . KNEE SURGERY Right     Family History  Problem Relation Age of Onset  . Hypertension Mother   . Alcohol abuse Father   . Cancer Father        Colon    Social History   Social History  . Marital status: Married    Spouse name: Laveda Abbe   . Number of children: N/A  . 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 Smoking status: Former Smoker    Packs/day: 0.25    Years: 10.00    Types: Cigarettes    Start date: 04/25/1983    Quit date: 04/24/1993  . Smokeless tobacco: Never Used  . Alcohol use No  . Drug use: No  . Sexual activity: Not Currently   Other Topics Concern  . Not on file   Social History Narrative  . No narrative on file     Current Outpatient Prescriptions:  .  aspirin EC 81 MG tablet, Take 1 tablet (81 mg total) by mouth daily., Disp: 30 tablet, Rfl: 0 .  Cholecalciferol (VITAMIN D HIGH POTENCY) 1000 UNITS capsule, Take 1 tablet by mouth daily., Disp: , Rfl:  .  LORazepam (ATIVAN) 1 MG tablet, Take 1 tablet (1 mg total) by mouth as needed., Disp: 10 tablet, Rfl: 0 .  pravastatin (PRAVACHOL) 20 MG tablet, Take 1 tablet by mouth at bedtime., Disp: , Rfl:  .  SYNTHROID 100 MCG tablet, TAKE 1 TABLET BY MOUTH  MONDAY THROUGH SATURDAY AND 1 AND 1/2 TABLETS BY MOUTH  ON SUNDAY, Disp: 96 tablet, Rfl: 0 .  Methylcobalamin (B12-ACTIVE) 1 MG CHEW, Chew 1 tablet by mouth daily., Disp: , Rfl:   No Known Allergies   ROS   Constitutional: Negative for fever or weight change.  Respiratory: Negative for cough and shortness of breath.   Cardiovascular: Negative for chest pain or palpitations.  Gastrointestinal: Negative for abdominal pain, no bowel changes.  Musculoskeletal: Negative for gait problem or joint swelling.  Skin: Negative for  rash.  Neurological: Negative for dizziness or headache.  No other specific complaints in a complete review of systems (except as listed in HPI above).   Objective  Vitals:   01/16/17 0827  BP: 118/64  Pulse: 74  Resp: 18  Temp: 98.5 F (36.9 C)  TempSrc: Oral  SpO2: 97%  Weight: 154 lb 9.6 oz (70.1 kg)  Height: 5\' 3"  (1.6 m)    Body mass index is 27.39 kg/m.  Physical Exam  Constitutional: Patient appears well-developed and well-nourished. No distress.  HENT: Head: Normocephalic and atraumatic. Ears:  B TMs with scarring, she has hearing loss , no erythema or effusion; Nose: Nose normal. Mouth/Throat: Oropharynx is clear and moist. No oropharyngeal exudate.  Eyes: Conjunctivae and EOM are normal. Pupils are equal, round, and reactive to light. No scleral icterus.  Neck: Normal range of motion. Neck supple. No JVD present. No thyromegaly present.  Cardiovascular: Normal rate, regular rhythm and normal heart sounds.  No murmur heard. No BLE edema. Pulmonary/Chest: Effort normal and breath sounds normal. No respiratory distress. Abdominal: Soft. Bowel sounds are normal, no distension. There is no tenderness. no masses Breast: no lumps or masses, no nipple discharge or rashes FEMALE GENITALIA:  External genitalia , atrophy noticed on introitus  External urethra normal Pelvic done RECTAL: not done Musculoskeletal: Normal range of motion, no joint effusions. No gross deformities Neurological: he is alert and oriented to person, place, and time. No cranial nerve deficit. Coordination, balance, strength, speech and gait are normal.  Skin: Skin is warm and dry. No rash noted. No erythema.  Psychiatric: Patient has a normal mood and affect. behavior is normal. Judgment and thought content normal.   Recent Results (from the past 2160 hour(s))  Comp. Metabolic Panel (12)     Status: Abnormal   Collection Time: 01/12/17  8:31 AM  Result Value Ref Range   Glucose 90 65 - 99 mg/dL    BUN 11 6 - 24 mg/dL   Creatinine, Ser 0.82 0.57 - 1.00 mg/dL   GFR calc non Af Amer 81 >59 mL/min/1.73   GFR calc Af Amer 93 >59 mL/min/1.73   BUN/Creatinine Ratio 13 9 - 23   Sodium 143 134 - 144 mmol/L   Potassium 4.5 3.5 - 5.2 mmol/L   Chloride 104 96 - 106 mmol/L   Calcium 9.6 8.7 - 10.2 mg/dL   Total Protein 6.6 6.0 - 8.5 g/dL   Albumin 4.6 3.5 - 5.5 g/dL   Globulin, Total 2.0 1.5 - 4.5 g/dL   Albumin/Globulin Ratio 2.3 (H) 1.2 - 2.2   Bilirubin Total 0.4 0.0 - 1.2 mg/dL   Alkaline Phosphatase 65 39 - 117 IU/L   AST 23 0 - 40 IU/L  CBC with Differential/Platelet     Status: None   Collection Time: 01/12/17  8:31 AM  Result Value Ref Range   WBC 3.6 3.4 - 10.8 x10E3/uL   RBC 4.08 3.77 - 5.28 x10E6/uL   Hemoglobin 13.0 11.1 - 15.9 g/dL   Hematocrit 38.5 34.0 - 46.6 %   MCV 94 79 - 97 fL   MCH 31.9 26.6 - 33.0 pg   MCHC 33.8 31.5 - 35.7 g/dL   RDW 13.5 12.3 - 15.4 %   Platelets 178 150 - 379 x10E3/uL   Neutrophils 56 Not Estab. %   Lymphs 36 Not Estab. %   Monocytes 7 Not Estab. %   Eos 1 Not Estab. %   Basos 0 Not Estab. %   Neutrophils Absolute 2.0 1.4 - 7.0 x10E3/uL   Lymphocytes Absolute 1.3 0.7 - 3.1 x10E3/uL   Monocytes Absolute 0.2 0.1 - 0.9 x10E3/uL   EOS (ABSOLUTE) 0.1 0.0 - 0.4 x10E3/uL   Basophils Absolute 0.0 0.0 - 0.2 x10E3/uL   Immature Granulocytes 0 Not Estab. %   Immature Grans (Abs) 0.0 0.0 - 0.1 x10E3/uL  Lipid panel     Status: None   Collection Time: 01/12/17  8:31 AM  Result Value Ref Range   Cholesterol, Total 176 100 - 199 mg/dL   Triglycerides 77 0 - 149  mg/dL   HDL 66 >39 mg/dL   VLDL Cholesterol Cal 15 5 - 40 mg/dL   LDL Calculated 95 0 - 99 mg/dL   Chol/HDL Ratio 2.7 0.0 - 4.4 ratio    Comment:                                   T. Chol/HDL Ratio                                             Men  Women                               1/2 Avg.Risk  3.4    3.3                                   Avg.Risk  5.0    4.4                                 2X Avg.Risk  9.6    7.1                                3X Avg.Risk 23.4   11.0   Hemoglobin A1c     Status: None   Collection Time: 01/12/17  8:31 AM  Result Value Ref Range   Hgb A1c MFr Bld 5.2 4.8 - 5.6 %    Comment:          Prediabetes: 5.7 - 6.4          Diabetes: >6.4          Glycemic control for adults with diabetes: <7.0    Est. average glucose Bld gHb Est-mCnc 103 mg/dL  TSH     Status: None   Collection Time: 01/12/17  8:31 AM  Result Value Ref Range   TSH 3.170 0.450 - 4.500 uIU/mL  VITAMIN D 25 Hydroxy (Vit-D Deficiency, Fractures)     Status: None   Collection Time: 01/12/17  8:31 AM  Result Value Ref Range   Vit D, 25-Hydroxy 35.8 30.0 - 100.0 ng/mL    Comment: Vitamin D deficiency has been defined by the [place] and an Endocrine Society practice guideline as a level of serum 25-OH vitamin D less than 20 ng/mL (1,2). The Endocrine Society went on to further define vitamin D insufficiency as a level between 21 and 29 ng/mL (2). 1. IOM (Institute of Medicine). 2010. Dietary reference    intakes for calcium and D. [place]: The    [employer]. 2. Holick MF, Binkley [place], Bischoff-Ferrari HA, et al.    Evaluation, treatment, and prevention of vitamin D    deficiency: an Endocrine Society clinical practice    guideline. JCEM. 2011 Jul; 96(7):1911-30.   Vitamin B12     Status: None   Collection Time: 01/12/17  8:31 AM  Result Value Ref Range   Vitamin B-12 663 232 - 1,245 pg/mL    PHQ2/9: Depression screen [facility] 2/9 01/16/2017 07/31/2016 07/13/2016 12/22/2015 07/29/2015  Decreased Interest 0 0 0  0 0  Down, Depressed, Hopeless 0 0 0 0 0  PHQ - 2 Score 0 0 0 0 0     Fall Risk: Fall Risk  01/16/2017 07/31/2016 07/13/2016 12/22/2015 07/29/2015  Falls in the past year? No No No No No    Functional Status Survey: Is the patient deaf or have difficulty hearing?: Yes Does the patient have difficulty seeing, even when wearing glasses/contacts?:  No Does the patient have difficulty concentrating, remembering, or making decisions?: No Does the patient have difficulty walking or climbing stairs?: No Does the patient have difficulty dressing or bathing?: No Does the patient have difficulty doing errands alone such as visiting a doctor's office or shopping?: No   Assessment & Plan  1. Well woman exam  Discussed importance of 150 minutes of physical activity weekly, eat two servings of fish weekly, eat one serving of tree nuts ( cashews, pistachios, pecans, almonds.Marland Kitchen) every other day, eat 6 servings of fruit/vegetables daily and drink plenty of water and avoid sweet beverages.  EKG  2. Need for influenza vaccination  - Flu Vaccine QUAD 36+ mos IM  3. EKG abnormalities  - EKG 12-Lead  Right bundle branch block is new when compared to EKG done in 2011, she is asymptomatic, discussed referral to cardiologist or monitoring for symptoms and she chose the later. She will call me back if she changes her mind  -USPSTF grade A and B recommendations reviewed with patient; age-appropriate recommendations, preventive care, screening tests, etc discussed and encouraged; healthy living encouraged; see AVS for patient education given to patient

## 2017-01-16 NOTE — Patient Instructions (Signed)
Preventive Care 40-64 Years, Female Preventive care refers to lifestyle choices and visits with your health care provider that can promote health and wellness. What does preventive care include?  A yearly physical exam. This is also called an annual well check.  Dental exams once or twice a year.  Routine eye exams. Ask your health care provider how often you should have your eyes checked.  Personal lifestyle choices, including: ? Daily care of your teeth and gums. ? Regular physical activity. ? Eating a healthy diet. ? Avoiding tobacco and drug use. ? Limiting alcohol use. ? Practicing safe sex. ? Taking low-dose aspirin daily starting at age 58. ? Taking vitamin and mineral supplements as recommended by your health care provider. What happens during an annual well check? The services and screenings done by your health care provider during your annual well check will depend on your age, overall health, lifestyle risk factors, and family history of disease. Counseling Your health care provider may ask you questions about your:  Alcohol use.  Tobacco use.  Drug use.  Emotional well-being.  Home and relationship well-being.  Sexual activity.  Eating habits.  Work and work Statistician.  Method of birth control.  Menstrual cycle.  Pregnancy history.  Screening You may have the following tests or measurements:  Height, weight, and BMI.  Blood pressure.  Lipid and cholesterol levels. These may be checked every 5 years, or more frequently if you are over 81 years old.  Skin check.  Lung cancer screening. You may have this screening every year starting at age 78 if you have a 30-pack-year history of smoking and currently smoke or have quit within the past 15 years.  Fecal occult blood test (FOBT) of the stool. You may have this test every year starting at age 65.  Flexible sigmoidoscopy or colonoscopy. You may have a sigmoidoscopy every 5 years or a colonoscopy  every 10 years starting at age 30.  Hepatitis C blood test.  Hepatitis B blood test.  Sexually transmitted disease (STD) testing.  Diabetes screening. This is done by checking your blood sugar (glucose) after you have not eaten for a while (fasting). You may have this done every 1-3 years.  Mammogram. This may be done every 1-2 years. Talk to your health care provider about when you should start having regular mammograms. This may depend on whether you have a family history of breast cancer.  BRCA-related cancer screening. This may be done if you have a family history of breast, ovarian, tubal, or peritoneal cancers.  Pelvic exam and Pap test. This may be done every 3 years starting at age 80. Starting at age 36, this may be done every 5 years if you have a Pap test in combination with an HPV test.  Bone density scan. This is done to screen for osteoporosis. You may have this scan if you are at high risk for osteoporosis.  Discuss your test results, treatment options, and if necessary, the need for more tests with your health care provider. Vaccines Your health care provider may recommend certain vaccines, such as:  Influenza vaccine. This is recommended every year.  Tetanus, diphtheria, and acellular pertussis (Tdap, Td) vaccine. You may need a Td booster every 10 years.  Varicella vaccine. You may need this if you have not been vaccinated.  Zoster vaccine. You may need this after age 5.  Measles, mumps, and rubella (MMR) vaccine. You may need at least one dose of MMR if you were born in  1957 or later. You may also need a second dose.  Pneumococcal 13-valent conjugate (PCV13) vaccine. You may need this if you have certain conditions and were not previously vaccinated.  Pneumococcal polysaccharide (PPSV23) vaccine. You may need one or two doses if you smoke cigarettes or if you have certain conditions.  Meningococcal vaccine. You may need this if you have certain  conditions.  Hepatitis A vaccine. You may need this if you have certain conditions or if you travel or work in places where you may be exposed to hepatitis A.  Hepatitis B vaccine. You may need this if you have certain conditions or if you travel or work in places where you may be exposed to hepatitis B.  Haemophilus influenzae type b (Hib) vaccine. You may need this if you have certain conditions.  Talk to your health care provider about which screenings and vaccines you need and how often you need them. This information is not intended to replace advice given to you by your health care provider. Make sure you discuss any questions you have with your health care provider. Document Released: 05/07/2015 Document Revised: 12/29/2015 Document Reviewed: 02/09/2015 Elsevier Interactive Patient Education  2017 [employer].

## 2017-02-05 ENCOUNTER — Ambulatory Visit
Admission: RE | Admit: 2017-02-05 | Discharge: 2017-02-05 | Disposition: A | Discharge status: Home / Self Care | Payer: 59 | Source: Ambulatory Visit | Attending: Family Medicine | Admitting: Family Medicine

## 2017-02-05 DIAGNOSIS — Z1231 Encounter for screening mammogram for malignant neoplasm of breast: Secondary | ICD-10-CM

## 2017-02-06 ENCOUNTER — Other Ambulatory Visit: Payer: Self-pay | Admitting: Family Medicine

## 2017-02-06 DIAGNOSIS — R928 Other abnormal and inconclusive findings on diagnostic imaging of breast: Secondary | ICD-10-CM

## 2017-02-06 DIAGNOSIS — N6489 Other specified disorders of breast: Secondary | ICD-10-CM

## 2017-02-09 ENCOUNTER — Other Ambulatory Visit: Payer: Self-pay

## 2017-02-09 MED ORDER — PRAVASTATIN SODIUM 20 MG PO TABS: 20.0000 mg | ORAL_TABLET | Freq: Every day | ORAL | 0 refills | Status: DC

## 2017-02-09 NOTE — Telephone Encounter (Signed)
Refill Request for Cholesterol medication: Pravastatin to [facility].   Last visit was on: 01/16/2017.  Lab Results  Component Value Date   CHOL 176 01/12/2017   HDL 66 01/12/2017   LDLCALC 95 01/12/2017   TRIG 77 01/12/2017   CHOLHDL 2.7 01/12/2017    Next visit: 05/18/2017

## 2017-02-15 ENCOUNTER — Ambulatory Visit
Admission: RE | Admit: 2017-02-15 | Discharge: 2017-02-15 | Disposition: A | Discharge status: Home / Self Care | Payer: 59 | Source: Ambulatory Visit | Attending: Family Medicine | Admitting: Family Medicine

## 2017-02-15 DIAGNOSIS — N6489 Other specified disorders of breast: Secondary | ICD-10-CM | POA: Diagnosis not present

## 2017-02-15 DIAGNOSIS — R928 Other abnormal and inconclusive findings on diagnostic imaging of breast: Secondary | ICD-10-CM | POA: Diagnosis present

## 2017-02-15 DIAGNOSIS — N6312 Unspecified lump in the right breast, upper inner quadrant: Secondary | ICD-10-CM | POA: Insufficient documentation

## 2017-02-15 DIAGNOSIS — N6311 Unspecified lump in the right breast, upper outer quadrant: Secondary | ICD-10-CM | POA: Insufficient documentation

## 2017-03-23 ENCOUNTER — Other Ambulatory Visit: Payer: Self-pay | Admitting: Family Medicine

## 2017-03-23 DIAGNOSIS — E039 Hypothyroidism, unspecified: Secondary | ICD-10-CM

## 2017-03-30 ENCOUNTER — Other Ambulatory Visit: Payer: Self-pay | Admitting: Family Medicine

## 2017-05-14 ENCOUNTER — Telehealth: Payer: Self-pay | Admitting: Family Medicine

## 2017-05-14 NOTE — Telephone Encounter (Unsigned)
Copied from [place]. Topic: Quick Communication - See Telephone Encounter >> May 14, 2017  2:44 PM Hewitt Shorts wrote: CRM for notification. See Telephone encounter for:  Pt is wanting to have lab orders for her visit on Friday put into system so she can come in advance to do thyroid labs and cholesteral -all that she would do for a 4 month follow up Best number   05/14/17.

## 2017-05-15 ENCOUNTER — Other Ambulatory Visit: Payer: Self-pay | Admitting: Emergency Medicine

## 2017-05-15 ENCOUNTER — Other Ambulatory Visit: Payer: Self-pay | Admitting: Family Medicine

## 2017-05-15 DIAGNOSIS — E039 Hypothyroidism, unspecified: Secondary | ICD-10-CM

## 2017-05-15 DIAGNOSIS — E538 Deficiency of other specified B group vitamins: Secondary | ICD-10-CM

## 2017-05-15 DIAGNOSIS — E785 Hyperlipidemia, unspecified: Secondary | ICD-10-CM

## 2017-05-15 DIAGNOSIS — Z79899 Other long term (current) drug therapy: Secondary | ICD-10-CM

## 2017-05-15 DIAGNOSIS — E559 Vitamin D deficiency, unspecified: Secondary | ICD-10-CM

## 2017-05-15 NOTE — Telephone Encounter (Signed)
Patient notified

## 2017-05-15 NOTE — Telephone Encounter (Signed)
Labs ready to be done

## 2017-05-17 LAB — COMPREHENSIVE METABOLIC PANEL
A/G RATIO: 2.1 (ref 1.2–2.2)
ALT: 13 IU/L (ref 0–32)
AST: 19 IU/L (ref 0–40)
Albumin: 4.5 g/dL (ref 3.5–5.5)
Alkaline Phosphatase: 58 IU/L (ref 39–117)
BILIRUBIN TOTAL: 0.3 mg/dL (ref 0.0–1.2)
BUN / CREAT RATIO: 12 (ref 9–23)
BUN: 10 mg/dL (ref 6–24)
CHLORIDE: 103 mmol/L (ref 96–106)
CO2: 25 mmol/L (ref 20–29)
Calcium: 9.7 mg/dL (ref 8.7–10.2)
Creatinine, Ser: 0.81 mg/dL (ref 0.57–1.00)
GFR calc non Af Amer: 82 mL/min/{1.73_m2} (ref >59)
GFR, EST AFRICAN AMERICAN: 95 mL/min/{1.73_m2} (ref >59)
Globulin, Total: 2.1 g/dL (ref 1.5–4.5)
Glucose: 96 mg/dL (ref 65–99)
POTASSIUM: 4.7 mmol/L (ref 3.5–5.2)
Sodium: 142 mmol/L (ref 134–144)
TOTAL PROTEIN: 6.6 g/dL (ref 6.0–8.5)

## 2017-05-17 LAB — LIPID PANEL
Chol/HDL Ratio: 2.8 ratio (ref 0.0–4.4)
Cholesterol, Total: 196 mg/dL (ref 100–199)
HDL: 69 mg/dL (ref >39)
LDL CALC: 109 mg/dL — AB (ref 0–99)
Triglycerides: 90 mg/dL (ref 0–149)
VLDL CHOLESTEROL CAL: 18 mg/dL (ref 5–40)

## 2017-05-17 LAB — VITAMIN D 25 HYDROXY (VIT D DEFICIENCY, FRACTURES): Vit D, 25-Hydroxy: 33.9 ng/mL (ref 30.0–100.0)

## 2017-05-17 LAB — TSH: TSH: 2.8 u[IU]/mL (ref 0.450–4.500)

## 2017-05-17 LAB — VITAMIN B12: VITAMIN B 12: 425 pg/mL (ref 232–1245)

## 2017-05-18 ENCOUNTER — Ambulatory Visit: Payer: Managed Care, Other (non HMO) | Admitting: Family Medicine

## 2017-05-18 ENCOUNTER — Encounter: Payer: Self-pay | Admitting: Family Medicine

## 2017-05-18 VITALS — BP 112/60 | HR 85 | Temp 98.0°F | Resp 16 | Ht 63.0 in | Wt 152.7 lb

## 2017-05-18 DIAGNOSIS — E559 Vitamin D deficiency, unspecified: Secondary | ICD-10-CM | POA: Diagnosis not present

## 2017-05-18 DIAGNOSIS — R7989 Other specified abnormal findings of blood chemistry: Secondary | ICD-10-CM | POA: Diagnosis not present

## 2017-05-18 DIAGNOSIS — G4700 Insomnia, unspecified: Secondary | ICD-10-CM

## 2017-05-18 DIAGNOSIS — E039 Hypothyroidism, unspecified: Secondary | ICD-10-CM | POA: Diagnosis not present

## 2017-05-18 DIAGNOSIS — E785 Hyperlipidemia, unspecified: Secondary | ICD-10-CM

## 2017-05-18 DIAGNOSIS — R928 Other abnormal and inconclusive findings on diagnostic imaging of breast: Secondary | ICD-10-CM

## 2017-05-18 DIAGNOSIS — E538 Deficiency of other specified B group vitamins: Secondary | ICD-10-CM

## 2017-05-18 MED ORDER — PRAVASTATIN SODIUM 20 MG PO TABS: 20.0000 mg | ORAL_TABLET | Freq: Every day | ORAL | 1 refills | Status: DC

## 2017-05-18 MED ORDER — SYNTHROID 100 MCG PO TABS: ORAL_TABLET | ORAL | 0 refills | Status: DC

## 2017-05-18 MED ORDER — LORAZEPAM 1 MG PO TABS: 1.0000 mg | ORAL_TABLET | ORAL | 0 refills | Status: DC | PRN

## 2017-05-18 NOTE — Progress Notes (Signed)
Name: Taylor Gilmore   MRN: [id number]    DOB: 07-08-1960   Date:05/18/2017       Progress Note  Subjective  Chief Complaint  Chief Complaint  Patient presents with  . Medication Refill    4 month F/U  . Dyslipidemia  . Insomnia    Still waking up periodically throughout the night. Takes Lorazepam very sporadically   . Hypothyroidism    Denies any symptoms    HPI  Dyslipidemia: she is now on Pravastatin and tolerating well, while on Crestor she has leg pains. Last LDL was slightly above 100 but is at goal, HDL is excellent.   Low B12: history of low B12 but it went high, currently off medication, but B12 is trending down again, advised to take B12 a few times a week and recheck next visit  Vitamin D : taking otc supplementation and vitamin D is at goal  Insomnia: she takes Lorazepam very seldom, she still has a few pills left and last rx was written 06/2016, we will refill it today. It helps her fall and stay asleep  Hypothyroidism: last TSH was at goal, denies constipation, palpitation, diarrhea, dysphagia.   Overweight: history of obesity, highest weight was 180 lbs but she has been eating healthy, currently at weight watchers, also stays active, and 10000 steps most days of the week. Feeling well, but would like to go down to 140 lbs.    Patient Active Problem List   Diagnosis Date Noted  . Anterior knee pain 01/10/2015  . Symptomatic menopausal or female climacteric states 01/10/2015  . Otosclerosis of both ears 01/10/2015  . Allergic rhinitis 01/10/2015  . Hypothyroidism, adult 01/04/2015  . Dyslipidemia 01/04/2015  . Overweight (BMI 25.0-29.9) 01/04/2015  . Vitamin D deficiency 01/04/2015  . B12 deficiency 01/04/2015  . Left anterior knee pain 01/04/2015  . Bilateral hearing loss 01/04/2015  . Anxiety disorder 01/04/2015  . Personal history of healed traumatic fracture 01/29/2013    Past Surgical History:  Procedure Laterality Date  . EXTERNAL EAR  SURGERY Right    6th grade  . KNEE SURGERY Right     Family History  Problem Relation Age of Onset  . Hypertension Mother   . Alcohol abuse Father   . Cancer Father        Colon    Social History   Socioeconomic History  . Marital status: Married    Spouse name: Laveda Abbe   . Number of children: Not on file  . Years of education: Not on file  . Highest education level: Not on file  Social Needs  . Financial resource strain: Not on file  . Food insecurity - worry: Not on file  . Food insecurity - inability: Not on file  . Transportation needs - medical: Not on file  . Transportation needs - non-medical: Not on file  Occupational History  . Occupation: Press photographer: [employer]  Tobacco Use  . Smoking status: Former Smoker    Packs/day: 0.25    Years: 10.00    Pack years: 2.50    Types: Cigarettes    Start date: 04/25/1983    Last attempt to quit: 04/24/1993    Years since quitting: 24.0  . Smokeless tobacco: Never Used  Substance and Sexual Activity  . Alcohol use: No    Alcohol/week: 0.0 oz  . Drug use: No  . Sexual activity: Not Currently  Other Topics Concern  . Not on file  Social History  Narrative  . Not on file     Current Outpatient Medications:  .  aspirin EC 81 MG tablet, Take 1 tablet (81 mg total) by mouth daily., Disp: 30 tablet, Rfl: 0 .  Cholecalciferol (VITAMIN D HIGH POTENCY) 1000 UNITS capsule, Take 1 tablet by mouth daily., Disp: , Rfl:  .  LORazepam (ATIVAN) 1 MG tablet, Take 1 tablet (1 mg total) by mouth as needed., Disp: 10 tablet, Rfl: 0 .  pravastatin (PRAVACHOL) 20 MG tablet, Take 1 tablet (20 mg total) by mouth at bedtime., Disp: 90 tablet, Rfl: 0 .  SYNTHROID 100 MCG tablet, TAKE 1 TABLET BY MOUTH  MONDAY THROUGH SATURDAY.  TAKE 1 AND 1/2 TABLETS ON  SUNDAY, Disp: 96 tablet, Rfl: 0 .  Methylcobalamin (B12-ACTIVE) 1 MG CHEW, Chew 1 tablet by mouth daily., Disp: , Rfl:   No Known Allergies   ROS   Constitutional: Negative for fever  or weight change.  Respiratory: Negative for cough and shortness of breath.   Cardiovascular: Negative for chest pain or palpitations.  Gastrointestinal: Negative for abdominal pain, no bowel changes.  Musculoskeletal: Negative for gait problem or joint swelling.  Skin: Negative for rash.  Neurological: Negative for dizziness or headache.  No other specific complaints in a complete review of systems (except as listed in HPI above).   Objective  Vitals:   05/18/17 1538  BP: 112/60  Pulse: 85  Resp: 16  Temp: 98 F (36.7 C)  TempSrc: Oral  SpO2: 98%  Weight: 152 lb 11.2 oz (69.3 kg)  Height: 5\' 3"  (1.6 m)    Body mass index is 27.05 kg/m.  Physical Exam  Constitutional: Patient appears well-developed and well-nourished. Overweight. No distress.  HEENT: head atraumatic, normocephalic, pupils equal and reactive to light, neck supple, throat within normal limits Cardiovascular: Normal rate, regular rhythm and normal heart sounds.  No murmur heard. No BLE edema. Pulmonary/Chest: Effort normal and breath sounds normal. No respiratory distress. Abdominal: Soft.  There is no tenderness. Psychiatric: Patient has a normal mood and affect. behavior is normal. Judgment and thought content normal.  Recent Results (from the past 2160 hour(s))  VITAMIN D 25 Hydroxy (Vit-D Deficiency, Fractures)     Status: None   Collection Time: 05/16/17  7:07 AM  Result Value Ref Range   Vit D, 25-Hydroxy 33.9 30.0 - 100.0 ng/mL    Comment: Vitamin D deficiency has been defined by the [place] practice guideline as a level of serum 25-OH vitamin D less than 20 ng/mL (1,2). The Endocrine Society went on to further define vitamin D insufficiency as a level between 21 and 29 ng/mL (2). 1. IOM (Institute of Medicine). 2010. Dietary reference    intakes for calcium and D. [place]: The    [employer]. 2. Holick MF, Binkley Callao, Bischoff-Ferrari HA,  et al.    Evaluation, treatment, and prevention of vitamin D    deficiency: an Endocrine Society clinical practice    guideline. JCEM. 2011 Jul; 96(7):1911-30.   Vitamin B12     Status: None   Collection Time: 05/16/17  7:07 AM  Result Value Ref Range   Vitamin B-12 425 232 - 1,245 pg/mL  Lipid panel     Status: Abnormal   Collection Time: 05/16/17  7:07 AM  Result Value Ref Range   Cholesterol, Total 196 100 - 199 mg/dL   Triglycerides 90 0 - 149 mg/dL   HDL 69 >39 mg/dL   VLDL  Cholesterol Cal 18 5 - 40 mg/dL   LDL Calculated 109 (H) 0 - 99 mg/dL   Chol/HDL Ratio 2.8 0.0 - 4.4 ratio    Comment:                                   T. Chol/HDL Ratio                                             Men  Women                               1/2 Avg.Risk  3.4    3.3                                   Avg.Risk  5.0    4.4                                2X Avg.Risk  9.6    7.1                                3X Avg.Risk 23.4   11.0   TSH     Status: None   Collection Time: 05/16/17  7:07 AM  Result Value Ref Range   TSH 2.800 0.450 - 4.500 uIU/mL  Comprehensive metabolic panel     Status: None   Collection Time: 05/16/17  7:07 AM  Result Value Ref Range   Glucose 96 65 - 99 mg/dL   BUN 10 6 - 24 mg/dL   Creatinine, Ser 0.81 0.57 - 1.00 mg/dL   GFR calc non Af Amer 82 >59 mL/min/1.73   GFR calc Af Amer 95 >59 mL/min/1.73   BUN/Creatinine Ratio 12 9 - 23   Sodium 142 134 - 144 mmol/L   Potassium 4.7 3.5 - 5.2 mmol/L   Chloride 103 96 - 106 mmol/L   CO2 25 20 - 29 mmol/L   Calcium 9.7 8.7 - 10.2 mg/dL   Total Protein 6.6 6.0 - 8.5 g/dL   Albumin 4.5 3.5 - 5.5 g/dL   Globulin, Total 2.1 1.5 - 4.5 g/dL   Albumin/Globulin Ratio 2.1 1.2 - 2.2   Bilirubin Total 0.3 0.0 - 1.2 mg/dL   Alkaline Phosphatase 58 39 - 117 IU/L   AST 19 0 - 40 IU/L   ALT 13 0 - 32 IU/L      PHQ2/9: Depression screen [facility] 2/9 05/18/2017 01/16/2017 07/31/2016 07/13/2016 12/22/2015  Decreased Interest 0 0 0 0 0   Down, Depressed, Hopeless 0 0 0 0 0  PHQ - 2 Score 0 0 0 0 0     Fall Risk: Fall Risk  05/18/2017 01/16/2017 07/31/2016 07/13/2016 12/22/2015  Falls in the past year? No No No No No     Functional Status Survey: Is the patient deaf or have difficulty hearing?: No Does the patient have difficulty seeing, even when wearing glasses/contacts?: No Does the patient have difficulty concentrating, remembering, or making decisions?: No Does the patient have difficulty walking or climbing stairs?: No Does the  patient have difficulty dressing or bathing?: No Does the patient have difficulty doing errands alone such as visiting a doctor's office or shopping?: No    Assessment & Plan  1. Insomnia, unspecified type  - LORazepam (ATIVAN) 1 MG tablet; Take 1 tablet (1 mg total) by mouth as needed.  Dispense: 10 tablet; Refill: 0  2. Low vitamin B12 level  Trending down again, resume B12 sl a few times a week   3. Dyslipidemia  Continue pravastatin  4. Vitamin D deficiency  Continue supplementation   5. Hypothyroidism, adult  - SYNTHROID 100 MCG tablet; TAKE 1 TABLET BY MOUTH  MONDAY THROUGH SATURDAY.  TAKE 1 AND 1/2 TABLETS ON  SUNDAY  Dispense: 96 tablet; Refill: 0  6. Abnormal mammogram of right breast  - MM Digital Screening Unilat R; Future - US BREAST LTD UNI RIGHT INC AXILLA; Future

## 2017-05-21 NOTE — Addendum Note (Signed)
Addended by: Steele Sizer F on: 05/21/2017 11:29 AM   Modules accepted: Orders

## 2017-05-21 NOTE — Addendum Note (Signed)
Addended by: Vonna Kotyk L on: 05/21/2017 10:14 AM   Modules accepted: Orders

## 2017-08-12 ENCOUNTER — Other Ambulatory Visit: Payer: Self-pay | Admitting: Family Medicine

## 2017-08-12 DIAGNOSIS — E039 Hypothyroidism, unspecified: Secondary | ICD-10-CM

## 2017-08-17 ENCOUNTER — Ambulatory Visit
Admission: RE | Admit: 2017-08-17 | Discharge: 2017-08-17 | Disposition: A | Discharge status: Home / Self Care | Payer: Managed Care, Other (non HMO) | Source: Ambulatory Visit | Attending: Family Medicine | Admitting: Family Medicine

## 2017-08-17 DIAGNOSIS — R928 Other abnormal and inconclusive findings on diagnostic imaging of breast: Secondary | ICD-10-CM | POA: Diagnosis present

## 2017-09-10 ENCOUNTER — Telehealth: Payer: Self-pay

## 2017-09-10 NOTE — Telephone Encounter (Signed)
Copied from [place] [phone]. Topic: General - Other >> Sep 10, 2017 10:55 AM Synthia Innocent wrote: Reason for CRM: Requesting to pick up lab order for appt on 09/14/17, would like labs prior to appt.

## 2017-09-11 ENCOUNTER — Other Ambulatory Visit: Payer: Self-pay

## 2017-09-11 ENCOUNTER — Other Ambulatory Visit: Payer: Self-pay | Admitting: Family Medicine

## 2017-09-11 DIAGNOSIS — E785 Hyperlipidemia, unspecified: Secondary | ICD-10-CM

## 2017-09-11 DIAGNOSIS — E538 Deficiency of other specified B group vitamins: Secondary | ICD-10-CM

## 2017-09-11 DIAGNOSIS — E559 Vitamin D deficiency, unspecified: Secondary | ICD-10-CM

## 2017-09-11 DIAGNOSIS — E039 Hypothyroidism, unspecified: Secondary | ICD-10-CM

## 2017-09-11 DIAGNOSIS — E663 Overweight: Secondary | ICD-10-CM

## 2017-09-11 NOTE — Telephone Encounter (Signed)
Ordered labs, please let her know ready to be picked up, but she does not necessarily needs labs this visit

## 2017-09-11 NOTE — Telephone Encounter (Signed)
Patient notified and patient would like [place] to fax her lab slip tomorrow and will call with fax number.

## 2017-09-11 NOTE — Progress Notes (Unsigned)
cmp

## 2017-09-12 NOTE — Telephone Encounter (Signed)
Please fax lab orders to [phone]

## 2017-09-14 ENCOUNTER — Encounter: Payer: Self-pay | Admitting: Family Medicine

## 2017-09-14 ENCOUNTER — Ambulatory Visit: Payer: Managed Care, Other (non HMO) | Admitting: Family Medicine

## 2017-09-14 VITALS — BP 110/80 | HR 64 | Ht 63.0 in | Wt 153.4 lb

## 2017-09-14 DIAGNOSIS — E785 Hyperlipidemia, unspecified: Secondary | ICD-10-CM | POA: Diagnosis not present

## 2017-09-14 DIAGNOSIS — E039 Hypothyroidism, unspecified: Secondary | ICD-10-CM

## 2017-09-14 DIAGNOSIS — E538 Deficiency of other specified B group vitamins: Secondary | ICD-10-CM

## 2017-09-14 DIAGNOSIS — E663 Overweight: Secondary | ICD-10-CM

## 2017-09-14 LAB — LIPID PANEL
CHOLESTEROL TOTAL: 203 mg/dL — AB (ref 100–199)
Chol/HDL Ratio: 2.8 ratio (ref 0.0–4.4)
HDL: 73 mg/dL (ref >39)
LDL Calculated: 111 mg/dL — ABNORMAL HIGH (ref 0–99)
TRIGLYCERIDES: 97 mg/dL (ref 0–149)
VLDL CHOLESTEROL CAL: 19 mg/dL (ref 5–40)

## 2017-09-14 LAB — CMP14+EGFR
ALT: 17 IU/L (ref 0–32)
AST: 16 IU/L (ref 0–40)
Albumin/Globulin Ratio: 2.2 (ref 1.2–2.2)
Albumin: 4.3 g/dL (ref 3.5–5.5)
Alkaline Phosphatase: 55 IU/L (ref 39–117)
BUN/Creatinine Ratio: 15 (ref 9–23)
BUN: 12 mg/dL (ref 6–24)
Bilirubin Total: 0.3 mg/dL (ref 0.0–1.2)
CALCIUM: 9.9 mg/dL (ref 8.7–10.2)
CO2: 25 mmol/L (ref 20–29)
CREATININE: 0.81 mg/dL (ref 0.57–1.00)
Chloride: 105 mmol/L (ref 96–106)
GFR calc Af Amer: 95 mL/min/{1.73_m2} (ref >59)
GFR, EST NON AFRICAN AMERICAN: 82 mL/min/{1.73_m2} (ref >59)
GLOBULIN, TOTAL: 2 g/dL (ref 1.5–4.5)
Glucose: 94 mg/dL (ref 65–99)
Potassium: 4.7 mmol/L (ref 3.5–5.2)
SODIUM: 140 mmol/L (ref 134–144)
Total Protein: 6.3 g/dL (ref 6.0–8.5)

## 2017-09-14 LAB — CBC WITH DIFFERENTIAL/PLATELET
BASOS ABS: 0 10*3/uL (ref 0.0–0.2)
Basos: 1 %
EOS (ABSOLUTE): 0.1 10*3/uL (ref 0.0–0.4)
EOS: 2 %
HEMOGLOBIN: 13.4 g/dL (ref 11.1–15.9)
Hematocrit: 39.6 % (ref 34.0–46.6)
IMMATURE GRANULOCYTES: 0 %
Immature Grans (Abs): 0 10*3/uL (ref 0.0–0.1)
LYMPHS: 30 %
Lymphocytes Absolute: 1.1 10*3/uL (ref 0.7–3.1)
MCH: 31.8 pg (ref 26.6–33.0)
MCHC: 33.8 g/dL (ref 31.5–35.7)
MCV: 94 fL (ref 79–97)
MONOCYTES: 7 %
Monocytes Absolute: 0.2 10*3/uL (ref 0.1–0.9)
NEUTROS PCT: 60 %
Neutrophils Absolute: 2.1 10*3/uL (ref 1.4–7.0)
Platelets: 192 10*3/uL (ref 150–450)
RBC: 4.21 x10E6/uL (ref 3.77–5.28)
RDW: 14 % (ref 12.3–15.4)
WBC: 3.5 10*3/uL (ref 3.4–10.8)

## 2017-09-14 LAB — TSH: TSH: 4.77 u[IU]/mL — ABNORMAL HIGH (ref 0.450–4.500)

## 2017-09-14 LAB — VITAMIN D 25 HYDROXY (VIT D DEFICIENCY, FRACTURES): Vit D, 25-Hydroxy: 34 ng/mL (ref 30.0–100.0)

## 2017-09-14 LAB — VITAMIN B12: Vitamin B-12: 564 pg/mL (ref 232–1245)

## 2017-09-14 LAB — HEMOGLOBIN A1C
Est. average glucose Bld gHb Est-mCnc: 105 mg/dL
Hgb A1c MFr Bld: 5.3 % (ref 4.8–5.6)

## 2017-09-14 MED ORDER — SYNTHROID 100 MCG PO TABS: ORAL_TABLET | ORAL | 0 refills | Status: DC

## 2017-09-14 MED ORDER — PRAVASTATIN SODIUM 40 MG PO TABS
40.0000 mg | ORAL_TABLET | Freq: Every day | ORAL | 1 refills | Status: DC
Start: 2017-09-14 — End: 2018-02-24

## 2017-09-14 NOTE — Progress Notes (Signed)
Name: Taylor Gilmore   MRN: [id number]    DOB: 08-08-1960   Date:09/14/2017       Progress Note  Subjective  Chief Complaint  Chief Complaint  Patient presents with  . Anxiety  . Hypothyroidism  . Hyperlipidemia    HPI  Dyslipidemia: she is now on Pravastatin and tolerating well, while on Crestor she has leg pains. Last LDL was slightly above 100 but is at goal, HDL is excellent. She is willing to try higher dose of Pravastatin now  Low B12: history of low B12 but it went high, currently off medication, but B12 is trending down again, advised to take B12 a few times a week on her last visit and she is doing well now, b12 is at goal  Vitamin D : taking otc supplementation and vitamin D is at goal.   Insomnia: she takes Lorazepam very seldom, she still has a few pills left and last rx was written 06/2016, she never filled last rx, still has two at home and will call back for refill if needed   Hypothyroidism: she denies constipation, palpitation, diarrhea, dysphagia. Feeling a little more tired lately, TSH is up and we will adjust dose of medication  Overweight: history of obesity, highest weight was 180 lbs but she has been eating healthy, currently at weight watchers, also stays active, she runs some, Tai Chi, goes to [facility] She is gettign 12000 steps daily    Patient Active Problem List   Diagnosis Date Noted  . Anterior knee pain 01/10/2015  . Symptomatic menopausal or female climacteric states 01/10/2015  . Otosclerosis of both ears 01/10/2015  . Allergic rhinitis 01/10/2015  . Hypothyroidism, adult 01/04/2015  . Dyslipidemia 01/04/2015  . Overweight (BMI 25.0-29.9) 01/04/2015  . Vitamin D deficiency 01/04/2015  . B12 deficiency 01/04/2015  . Left anterior knee pain 01/04/2015  . Bilateral hearing loss 01/04/2015  . Anxiety disorder 01/04/2015  . Personal history of healed traumatic fracture 01/29/2013    Past Surgical History:  Procedure Laterality Date  .  EXTERNAL EAR SURGERY Right    6th grade  . KNEE SURGERY Right     Family History  Problem Relation Age of Onset  . Hypertension Mother   . Alcohol abuse Father   . Cancer Father        Colon  . Breast cancer Neg Hx     Social History   Socioeconomic History  . Marital status: Married    Spouse name: Laveda Abbe   . Number of children: Not on file  . Years of education: Not on file  . Highest education level: Not on file  Occupational History  . 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 Financial resource strain: Not on file  . Food insecurity:    Worry: Not on file    Inability: Not on file  . Transportation needs:    Medical: Not on file    Non-medical: Not on file  Tobacco Use  . Smoking status: Former Smoker    Packs/day: 0.25    Years: 10.00    Pack years: 2.50    Types: Cigarettes    Start date: 04/25/1983    Last attempt to quit: 04/24/1993    Years since quitting: 24.4  . Smokeless tobacco: Never Used  Substance and Sexual Activity  . Alcohol use: No    Alcohol/week: 0.0 oz  . Drug use: No  . Sexual activity: Not Currently  Lifestyle  .  Physical activity:    Days per week: Not on file    Minutes per session: Not on file  . Stress: Not on file  Relationships  . Social connections:    Talks on phone: Not on file    Gets together: Not on file    Attends religious service: Not on file    Active member of club or organization: Not on file    Attends meetings of clubs or organizations: Not on file    Relationship status: Not on file  . Intimate partner violence:    Fear of current or ex partner: Not on file    Emotionally abused: Not on file    Physically abused: Not on file    Forced sexual activity: Not on file  Other Topics Concern  . Not on file  Social History Narrative  . Not on file     Current Outpatient Medications:  .  Cholecalciferol (VITAMIN D HIGH POTENCY) 1000 UNITS capsule, Take 1 tablet by mouth daily., Disp: , Rfl:   .  LORazepam (ATIVAN) 1 MG tablet, Take 1 tablet (1 mg total) by mouth as needed., Disp: 10 tablet, Rfl: 0 .  Methylcobalamin (B12-ACTIVE) 1 MG CHEW, Chew 1 tablet by mouth daily., Disp: , Rfl:  .  pravastatin (PRAVACHOL) 40 MG tablet, Take 1 tablet (40 mg total) by mouth at bedtime., Disp: 90 tablet, Rfl: 1 .  SYNTHROID 100 MCG tablet, One daily and one a half twice a week, Disp: 96 tablet, Rfl: 0  No Known Allergies   ROS  Constitutional: Negative for fever or weight change.  Respiratory: Negative for cough and shortness of breath.   Cardiovascular: Negative for chest pain or palpitations.  Gastrointestinal: Negative for abdominal pain, no bowel changes.  Musculoskeletal: Negative for gait problem or joint swelling.  Skin: Negative for rash.  Neurological: Negative for dizziness or headache.  No other specific complaints in a complete review of systems (except as listed in HPI above).  Objective  Vitals:   09/14/17 0750  BP: 110/80  Pulse: 64  SpO2: 98%  Weight: 153 lb 6.4 oz (69.6 kg)  Height: '5\' 3"'  (1.6 m)    Body mass index is 27.17 kg/m.  Physical Exam   Constitutional: Patient appears well-developed and well-nourished. Overweight.  No distress.  HEENT: head atraumatic, normocephalic, pupils equal and reactive to light,  neck supple, throat within normal limits Cardiovascular: Normal rate, regular rhythm and normal heart sounds.  No murmur heard. No BLE edema. Pulmonary/Chest: Effort normal and breath sounds normal. No respiratory distress. Abdominal: Soft.  There is no tenderness. Psychiatric: Patient has a normal mood and affect. behavior is normal. Judgment and thought content normal.  Recent Results (from the past 2160 hour(s))  CMP14+EGFR     Status: None   Collection Time: 09/13/17  7:22 AM  Result Value Ref Range   Glucose 94 65 - 99 mg/dL   BUN 12 6 - 24 mg/dL   Creatinine, Ser 0.81 0.57 - 1.00 mg/dL   GFR calc non Af Amer 82 >59 mL/min/1.73   GFR  calc Af Amer 95 >59 mL/min/1.73   BUN/Creatinine Ratio 15 9 - 23   Sodium 140 134 - 144 mmol/L   Potassium 4.7 3.5 - 5.2 mmol/L   Chloride 105 96 - 106 mmol/L   CO2 25 20 - 29 mmol/L   Calcium 9.9 8.7 - 10.2 mg/dL   Total Protein 6.3 6.0 - 8.5 g/dL   Albumin 4.3 3.5 - 5.5  g/dL   Globulin, Total 2.0 1.5 - 4.5 g/dL   Albumin/Globulin Ratio 2.2 1.2 - 2.2   Bilirubin Total 0.3 0.0 - 1.2 mg/dL   Alkaline Phosphatase 55 39 - 117 IU/L   AST 16 0 - 40 IU/L   ALT 17 0 - 32 IU/L  TSH     Status: Abnormal   Collection Time: 09/13/17  7:22 AM  Result Value Ref Range   TSH 4.770 (H) 0.450 - 4.500 uIU/mL  VITAMIN D 25 Hydroxy (Vit-D Deficiency, Fractures)     Status: None   Collection Time: 09/13/17  7:22 AM  Result Value Ref Range   Vit D, 25-Hydroxy 34.0 30.0 - 100.0 ng/mL    Comment: Vitamin D deficiency has been defined by the [place] and an Endocrine Society practice guideline as a level of serum 25-OH vitamin D less than 20 ng/mL (1,2). The Endocrine Society went on to further define vitamin D insufficiency as a level between 21 and 29 ng/mL (2). 1. IOM (Institute of Medicine). 2010. Dietary reference    intakes for calcium and D. [place]: The    [employer]. 2. Holick MF, Binkley [place], Bischoff-Ferrari HA, et al.    Evaluation, treatment, and prevention of vitamin D    deficiency: an Endocrine Society clinical practice    guideline. JCEM. 2011 Jul; 96(7):1911-30.   Lipid panel     Status: Abnormal   Collection Time: 09/13/17  7:22 AM  Result Value Ref Range   Cholesterol, Total 203 (H) 100 - 199 mg/dL   Triglycerides 97 0 - 149 mg/dL   HDL 73 >39 mg/dL   VLDL Cholesterol Cal 19 5 - 40 mg/dL   LDL Calculated 111 (H) 0 - 99 mg/dL   Chol/HDL Ratio 2.8 0.0 - 4.4 ratio    Comment:                                   T. Chol/HDL Ratio                                             Men  Women                               1/2 Avg.Risk  3.4    3.3                                    Avg.Risk  5.0    4.4                                2X Avg.Risk  9.6    7.1                                3X Avg.Risk 23.4   11.0   Hemoglobin A1c     Status: None   Collection Time: 09/13/17  7:22 AM  Result Value Ref Range   Hgb A1c MFr Bld 5.3 4.8 - 5.6 %    Comment:  Prediabetes: 5.7 - 6.4          Diabetes: >6.4          Glycemic control for adults with diabetes: <7.0    Est. average glucose Bld gHb Est-mCnc 105 mg/dL  Vitamin B12     Status: None   Collection Time: 09/13/17  7:22 AM  Result Value Ref Range   Vitamin B-12 564 232 - 1,245 pg/mL  CBC with Differential/Platelet     Status: None   Collection Time: 09/13/17  7:22 AM  Result Value Ref Range   WBC 3.5 3.4 - 10.8 x10E3/uL   RBC 4.21 3.77 - 5.28 x10E6/uL   Hemoglobin 13.4 11.1 - 15.9 g/dL   Hematocrit 39.6 34.0 - 46.6 %   MCV 94 79 - 97 fL   MCH 31.8 26.6 - 33.0 pg   MCHC 33.8 31.5 - 35.7 g/dL   RDW 14.0 12.3 - 15.4 %   Platelets 192 150 - 450 x10E3/uL    Comment:               **Please note reference interval change**   Neutrophils 60 Not Estab. %   Lymphs 30 Not Estab. %   Monocytes 7 Not Estab. %   Eos 2 Not Estab. %   Basos 1 Not Estab. %   Neutrophils Absolute 2.1 1.4 - 7.0 x10E3/uL   Lymphocytes Absolute 1.1 0.7 - 3.1 x10E3/uL   Monocytes Absolute 0.2 0.1 - 0.9 x10E3/uL   EOS (ABSOLUTE) 0.1 0.0 - 0.4 x10E3/uL   Basophils Absolute 0.0 0.0 - 0.2 x10E3/uL   Immature Granulocytes 0 Not Estab. %   Immature Grans (Abs) 0.0 0.0 - 0.1 x10E3/uL     PHQ2/9: Depression screen [facility] 2/9 05/18/2017 01/16/2017 07/31/2016 07/13/2016 12/22/2015  Decreased Interest 0 0 0 0 0  Down, Depressed, Hopeless 0 0 0 0 0  PHQ - 2 Score 0 0 0 0 0     Fall Risk: Fall Risk  09/14/2017 05/18/2017 01/16/2017 07/31/2016 07/13/2016  Falls in the past year? No No No No No     Functional Status Survey: Is the patient deaf or have difficulty hearing?: Yes(hearing loss on left side) Does the patient have  difficulty seeing, even when wearing glasses/contacts?: No Does the patient have difficulty concentrating, remembering, or making decisions?: No Does the patient have difficulty walking or climbing stairs?: No Does the patient have difficulty dressing or bathing?: No Does the patient have difficulty doing errands alone such as visiting a doctor's office or shopping?: No    Assessment & Plan  1. Hypothyroidism, adult  She still has a lot of synthroid at home, currently taking one daily and one and a half on Sundays, we will increase to one extra half a week for now, and if TSH at goal by next visit we can switch dose to 112 mcg daily   - SYNTHROID 100 MCG tablet; One daily and one a half twice a week  Dispense: 96 tablet; Refill: 0 - TSH   2. Dyslipidemia  - pravastatin (PRAVACHOL) 40 MG tablet; Take 1 tablet (40 mg total) by mouth at bedtime.  Dispense: 90 tablet; Refill: 1  Based on ASCVD of 0.6% in next 10 years and bleed of 0.7 % with aspirin, she will stay off medication at this time. LDl above 100 , she is willing to try higher dose of statin   3. Overweight (BMI 25.0-29.9)   4. B12 deficiency  Continue supplementation

## 2017-10-22 ENCOUNTER — Other Ambulatory Visit: Payer: Self-pay | Admitting: Family Medicine

## 2017-10-22 DIAGNOSIS — E039 Hypothyroidism, unspecified: Secondary | ICD-10-CM

## 2017-10-23 NOTE — Telephone Encounter (Signed)
Refill request for thyroid medication: Synthroid 100 mcg  Last Physical: 02/09/2017  Lab Results  Component Value Date   TSH 4.770 (H) 09/13/2017    Follow-ups on file. 01/15/2018

## 2017-11-10 LAB — TSH: TSH: 0.982 u[IU]/mL (ref 0.450–4.500)

## 2018-01-15 ENCOUNTER — Encounter: Payer: Self-pay | Admitting: Family Medicine

## 2018-01-15 ENCOUNTER — Ambulatory Visit: Payer: Managed Care, Other (non HMO) | Admitting: Family Medicine

## 2018-01-15 VITALS — BP 124/68 | HR 79 | Temp 97.8°F | Resp 16 | Ht 63.0 in | Wt 155.9 lb

## 2018-01-15 DIAGNOSIS — E663 Overweight: Secondary | ICD-10-CM

## 2018-01-15 DIAGNOSIS — E538 Deficiency of other specified B group vitamins: Secondary | ICD-10-CM

## 2018-01-15 DIAGNOSIS — H8093 Unspecified otosclerosis, bilateral: Secondary | ICD-10-CM

## 2018-01-15 DIAGNOSIS — Z23 Encounter for immunization: Secondary | ICD-10-CM | POA: Diagnosis not present

## 2018-01-15 DIAGNOSIS — E785 Hyperlipidemia, unspecified: Secondary | ICD-10-CM

## 2018-01-15 DIAGNOSIS — H900 Conductive hearing loss, bilateral: Secondary | ICD-10-CM

## 2018-01-15 DIAGNOSIS — E039 Hypothyroidism, unspecified: Secondary | ICD-10-CM

## 2018-01-15 DIAGNOSIS — G4700 Insomnia, unspecified: Secondary | ICD-10-CM

## 2018-01-15 DIAGNOSIS — E559 Vitamin D deficiency, unspecified: Secondary | ICD-10-CM

## 2018-01-15 DIAGNOSIS — Z79899 Other long term (current) drug therapy: Secondary | ICD-10-CM

## 2018-01-15 MED ORDER — LORAZEPAM 0.5 MG PO TABS: 0.5000 mg | ORAL_TABLET | ORAL | 0 refills | Status: DC | PRN

## 2018-01-15 NOTE — Addendum Note (Signed)
Addended by: Saunders Glance A on: 01/15/2018 08:25 AM   Modules accepted: Orders

## 2018-01-15 NOTE — Progress Notes (Signed)
Name: Taylor Gilmore   MRN: [id number]    DOB: January 15, 1961   Date:01/15/2018       Progress Note  Subjective  Chief Complaint  Chief Complaint  Patient presents with  . Follow-up  . Dyslipidemia  . Hypothyroidism  . Overweight  . B12 deficiency  . Medication Problem  . Laboring  . Immunizations    HPI  Dyslipidemia: sheis now on Pravastatin and tolerating well, while on Crestor she has leg pains. Last LDL was slightly above 100 but is at goal, HDL is excellent.We will recheck labs before wellness in October  LowB12: history of low B12 but it went high, she is now taking supplements three times weekly , we will recheck it today   Vitamin D : taking otc supplementation and vitamin D is at goal, she works for [employer] and would like to have it rechecked  Insomnia: she takes Lorazepam very seldom, she still has a few pills left and last rx was written 06/2016, she never filled last rx, we will send rx today of lower dose at 0.5 mg since she usually just takes a quarter pill   Hypothyroidism: she denies constipation, palpitation, diarrhea, dysphagia. TSH is up and we will adjust dose of medication and last level was at goal.   Overweight: history of obesity, highest weight was 180 lbs but she has been eating healthy, no longer at weight watchers, also stays active, she runs , Tai Chi, goes to [employer].  She is frustrated about not losing any more weight. Discussed referral to dietician and she will check about using her [employer] Spending money   Conductive hearing loss: 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 Stable, but has not been back to ENT if a long time.   Patient Active Problem List   Diagnosis Date Noted  . Anterior knee pain 01/10/2015  . Symptomatic menopausal or female climacteric states 01/10/2015  . Otosclerosis of both ears 01/10/2015  . Allergic rhinitis 01/10/2015  . Hypothyroidism, adult 01/04/2015  . Dyslipidemia 01/04/2015  . Overweight (BMI 25.0-29.9) 01/04/2015  .  Vitamin D deficiency 01/04/2015  . B12 deficiency 01/04/2015  . Left anterior knee pain 01/04/2015  . Bilateral hearing loss 01/04/2015  . Anxiety disorder 01/04/2015  . Personal history of healed traumatic fracture 01/29/2013    Past Surgical History:  Procedure Laterality Date  . EXTERNAL EAR SURGERY Right    6th grade  . KNEE SURGERY Right     Family History  Problem Relation Age of Onset  . Hypertension Mother   . Alcohol abuse Father   . Cancer Father        Colon  . Breast cancer Neg Hx     Social History   Socioeconomic History  . Marital status: Married    Spouse name: Laveda Abbe   . Number of children: 3  . Years of education: Not on file  . Highest education level: High school graduate  Occupational History  . 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 Financial resource strain: Not hard at all  . Food insecurity:    Worry: Never true    Inability: Never true  . Transportation needs:    Medical: No    Non-medical: No  Tobacco Use  . Smoking status: Former Smoker    Packs/day: 0.25    Years: 10.00    Pack years: 2.50    Types: Cigarettes    Start date: 04/25/1983    Last attempt to quit:  04/24/1993    Years since quitting: 24.7  . Smokeless tobacco: Never Used  Substance and Sexual Activity  . Alcohol use: No    Alcohol/week: 0.0 standard drinks  . Drug use: No  . Sexual activity: Not Currently  Lifestyle  . Physical activity:    Days per week: 5 days    Minutes per session: 60 min  . Stress: Not at all  Relationships  . Social connections:    Talks on phone: Twice a week    Gets together: Twice a week    Attends religious service: Never    Active member of club or organization: No    Attends meetings of clubs or organizations: Never    Relationship status: Married  . Intimate partner violence:    Fear of current or ex partner: No    Emotionally abused: No    Physically abused: No    Forced sexual activity: No  Other Topics  Concern  . Not on file  Social History Narrative  . Not on file     Current Outpatient Medications:  .  Cholecalciferol (VITAMIN D HIGH POTENCY) 1000 UNITS capsule, Take 1 tablet by mouth daily., Disp: , Rfl:  .  Methylcobalamin (B12-ACTIVE) 1 MG CHEW, Chew 1 tablet by mouth daily., Disp: , Rfl:  .  pravastatin (PRAVACHOL) 40 MG tablet, Take 1 tablet (40 mg total) by mouth at bedtime., Disp: 90 tablet, Rfl: 1 .  SYNTHROID 100 MCG tablet, One daily and one a half twice a week, Disp: 96 tablet, Rfl: 0 .  LORazepam (ATIVAN) 1 MG tablet, Take 1 tablet (1 mg total) by mouth as needed. (Patient not taking: Reported on 01/15/2018), Disp: 10 tablet, Rfl: 0  No Known Allergies  I personally reviewed active problem list, medication list, allergies, family history, social history with the patient/caregiver today.   ROS  Constitutional: Negative for fever or weight change.  Respiratory: Negative for cough and shortness of breath.   Cardiovascular: Negative for chest pain or palpitations.  Gastrointestinal: Negative for abdominal pain, no bowel changes.  Musculoskeletal: Negative for gait problem or joint swelling.  Skin: Negative for rash.  Neurological: Negative for dizziness or headache.  No other specific complaints in a complete review of systems (except as listed in HPI above).  Objective  Vitals:   01/15/18 0750  BP: 124/68  Pulse: 79  Resp: 16  Temp: 97.8 F (36.6 C)  TempSrc: Oral  SpO2: 99%  Weight: 155 lb 14.4 oz (70.7 kg)  Height: '5\' 3"'  (1.6 m)    Body mass index is 27.62 kg/m.  Physical Exam  Constitutional: Patient appears well-developed and well-nourished. Overweight.  No distress.  HEENT: head atraumatic, normocephalic, pupils equal and reactive to light, neck supple, throat within normal limits Cardiovascular: Normal rate, regular rhythm and normal heart sounds.  No murmur heard. No BLE edema. Pulmonary/Chest: Effort normal and breath sounds normal. No  respiratory distress. Abdominal: Soft.  There is no tenderness. Psychiatric: Patient has a normal mood and affect. behavior is normal. Judgment and thought content normal.  Recent Results (from the past 2160 hour(s))  TSH     Status: None   Collection Time: 11/09/17  7:39 AM  Result Value Ref Range   TSH 0.982 0.450 - 4.500 uIU/mL     PHQ2/9: Depression screen [facility] 2/9 01/15/2018 05/18/2017 01/16/2017 07/31/2016 07/13/2016  Decreased Interest 0 0 0 0 0  Down, Depressed, Hopeless 0 0 0 0 0  PHQ - 2 Score 0 0  0 0 0  Altered sleeping 0 - - - -  Tired, decreased energy 1 - - - -  Change in appetite 0 - - - -  Feeling bad or failure about yourself  0 - - - -  Trouble concentrating 0 - - - -  Moving slowly or fidgety/restless 0 - - - -  Suicidal thoughts 0 - - - -  PHQ-9 Score 1 - - - -     Fall Risk: Fall Risk  01/15/2018 09/14/2017 05/18/2017 01/16/2017 07/31/2016  Falls in the past year? No No No No No    Functional Status Survey: Is the patient deaf or have difficulty hearing?: Yes Does the patient have difficulty seeing, even when wearing glasses/contacts?: No Does the patient have difficulty concentrating, remembering, or making decisions?: No Does the patient have difficulty walking or climbing stairs?: No Does the patient have difficulty dressing or bathing?: No Does the patient have difficulty doing errands alone such as visiting a doctor's office or shopping?: No   Assessment & Plan  1. Hypothyroidism, adult  - TSH  2. Need for influenza vaccination  - Flu Vaccine QUAD 6+ mos PF IM (Fluarix Quad PF)  3. Dyslipidemia  - Lipid panel  4. Vitamin D deficiency  - VITAMIN D 25 Hydroxy (Vit-D Deficiency, Fractures)  5. Low serum vitamin B12  - CBC - Vitamin B12  6. Otosclerosis of both ears   7. Conductive hearing loss, bilateral   8. Long-term use of high-risk medication  - Comprehensive metabolic panel - Hemoglobin A1c  9. Overweight (BMI 25.0-29.9)  -  Hemoglobin A1c

## 2018-01-19 LAB — COMPREHENSIVE METABOLIC PANEL
ALK PHOS: 62 IU/L (ref 39–117)
ALT: 26 IU/L (ref 0–32)
AST: 23 IU/L (ref 0–40)
Albumin/Globulin Ratio: 2.6 — ABNORMAL HIGH (ref 1.2–2.2)
Albumin: 4.7 g/dL (ref 3.5–5.5)
BILIRUBIN TOTAL: 0.3 mg/dL (ref 0.0–1.2)
BUN / CREAT RATIO: 15 (ref 9–23)
BUN: 13 mg/dL (ref 6–24)
CO2: 23 mmol/L (ref 20–29)
CREATININE: 0.84 mg/dL (ref 0.57–1.00)
Calcium: 9.6 mg/dL (ref 8.7–10.2)
Chloride: 104 mmol/L (ref 96–106)
GFR calc Af Amer: 90 mL/min/{1.73_m2} (ref >59)
GFR calc non Af Amer: 78 mL/min/{1.73_m2} (ref >59)
GLUCOSE: 90 mg/dL (ref 65–99)
Globulin, Total: 1.8 g/dL (ref 1.5–4.5)
Potassium: 4.7 mmol/L (ref 3.5–5.2)
Sodium: 142 mmol/L (ref 134–144)
Total Protein: 6.5 g/dL (ref 6.0–8.5)

## 2018-01-19 LAB — LIPID PANEL
CHOL/HDL RATIO: 2.7 ratio (ref 0.0–4.4)
Cholesterol, Total: 206 mg/dL — ABNORMAL HIGH (ref 100–199)
HDL: 75 mg/dL (ref >39)
LDL CALC: 117 mg/dL — AB (ref 0–99)
TRIGLYCERIDES: 71 mg/dL (ref 0–149)
VLDL CHOLESTEROL CAL: 14 mg/dL (ref 5–40)

## 2018-01-19 LAB — CBC
HEMOGLOBIN: 13.6 g/dL (ref 11.1–15.9)
Hematocrit: 39.2 % (ref 34.0–46.6)
MCH: 32.1 pg (ref 26.6–33.0)
MCHC: 34.7 g/dL (ref 31.5–35.7)
MCV: 93 fL (ref 79–97)
PLATELETS: 234 10*3/uL (ref 150–450)
RBC: 4.24 x10E6/uL (ref 3.77–5.28)
RDW: 13 % (ref 12.3–15.4)
WBC: 3.8 10*3/uL (ref 3.4–10.8)

## 2018-01-19 LAB — VITAMIN D 25 HYDROXY (VIT D DEFICIENCY, FRACTURES): VIT D 25 HYDROXY: 32.6 ng/mL (ref 30.0–100.0)

## 2018-01-19 LAB — VITAMIN B12: VITAMIN B 12: 843 pg/mL (ref 232–1245)

## 2018-01-19 LAB — TSH: TSH: 1.3 u[IU]/mL (ref 0.450–4.500)

## 2018-01-19 LAB — HEMOGLOBIN A1C
ESTIMATED AVERAGE GLUCOSE: 108 mg/dL
Hgb A1c MFr Bld: 5.4 % (ref 4.8–5.6)

## 2018-01-22 ENCOUNTER — Ambulatory Visit (INDEPENDENT_AMBULATORY_CARE_PROVIDER_SITE_OTHER): Payer: Managed Care, Other (non HMO) | Admitting: Family Medicine

## 2018-01-22 ENCOUNTER — Encounter: Payer: Self-pay | Admitting: Family Medicine

## 2018-01-22 VITALS — BP 118/68 | HR 68 | Temp 98.1°F | Resp 14 | Ht 62.5 in | Wt 153.6 lb

## 2018-01-22 DIAGNOSIS — G4700 Insomnia, unspecified: Secondary | ICD-10-CM | POA: Diagnosis not present

## 2018-01-22 DIAGNOSIS — Z1239 Encounter for other screening for malignant neoplasm of breast: Secondary | ICD-10-CM | POA: Diagnosis not present

## 2018-01-22 DIAGNOSIS — E2839 Other primary ovarian failure: Secondary | ICD-10-CM

## 2018-01-22 DIAGNOSIS — Z124 Encounter for screening for malignant neoplasm of cervix: Secondary | ICD-10-CM | POA: Diagnosis not present

## 2018-01-22 DIAGNOSIS — Z01419 Encounter for gynecological examination (general) (routine) without abnormal findings: Secondary | ICD-10-CM | POA: Diagnosis not present

## 2018-01-22 MED ORDER — LORAZEPAM 0.5 MG PO TABS: 0.5000 mg | ORAL_TABLET | ORAL | 0 refills | Status: DC | PRN

## 2018-01-22 NOTE — Progress Notes (Signed)
Name: Taylor Gilmore   MRN: [id number]    DOB: 04-12-1961   Date:01/22/2018       Progress Note  Subjective  Chief Complaint  Chief Complaint  Patient presents with  . Annual Exam    HPI  Patient presents for annual CPE   Diet: she eats lean meat, vegetables, fruit, drinks plenty of water       Office Visit from 01/22/2018 in [facility]  AUDIT-C Score  0     Depression:  Depression screen [facility] 2/9 01/22/2018 01/15/2018 05/18/2017 01/16/2017 07/31/2016  Decreased Interest 0 0 0 0 0  Down, Depressed, Hopeless 0 0 0 0 0  PHQ - 2 Score 0 0 0 0 0  Altered sleeping 0 0 - - -  Tired, decreased energy 1 1 - - -  Change in appetite 0 0 - - -  Feeling bad or failure about yourself  0 0 - - -  Trouble concentrating 0 0 - - -  Moving slowly or fidgety/restless 0 0 - - -  Suicidal thoughts 0 0 - - -  PHQ-9 Score 1 1 - - -   Hypertension: BP Readings from Last 3 Encounters:  01/22/18 118/68  01/15/18 124/68  09/14/17 110/80   Obesity: Wt Readings from Last 3 Encounters:  01/22/18 153 lb 9.6 oz (69.7 kg)  01/15/18 155 lb 14.4 oz (70.7 kg)  09/14/17 153 lb 6.4 oz (69.6 kg)   BMI Readings from Last 3 Encounters:  01/22/18 27.65 kg/m  01/15/18 27.62 kg/m  09/14/17 27.17 kg/m    Hep C Screening: up to date STD testing and prevention (HIV/chl/gon/syphilis): N/A Intimate partner violence: negative screen  Sexual History/Pain during Intercourse: Menstrual History/LMP/Abnormal Bleeding: Incontinence Symptoms:   Advanced Care Planning: A voluntary discussion about advance care planning including the explanation and discussion of advance directives.  Discussed health care proxy and Living will, and the patient was able to identify a health care proxy as husband   Patient does not have a living will at present time.   Breast cancer:  HM Mammogram  Date Value Ref Range Status  02/03/2016 Self Reported Normal 0-4 Bi-Rad, Self Reported Normal Final     Comment:    In Chart-Normal Return in 1 year    BRCA gene screening: father had colon cancer in his 96's, one cousin had breast cancer, no ovarian cancer  Cervical cancer screening: she would like to have it done today   Osteoporosis Screening: we will order it today   Lipids:  Lab Results  Component Value Date   CHOL 206 (H) 01/18/2018   CHOL 203 (H) 09/13/2017   CHOL 196 05/16/2017   Lab Results  Component Value Date   HDL 75 01/18/2018   HDL 73 09/13/2017   HDL 69 05/16/2017   Lab Results  Component Value Date   LDLCALC 117 (H) 01/18/2018   LDLCALC 111 (H) 09/13/2017   LDLCALC 109 (H) 05/16/2017   Lab Results  Component Value Date   TRIG 71 01/18/2018   TRIG 97 09/13/2017   TRIG 90 05/16/2017   Lab Results  Component Value Date   CHOLHDL 2.7 01/18/2018   CHOLHDL 2.8 09/13/2017   CHOLHDL 2.8 05/16/2017   No results found for: LDLDIRECT  Glucose:  Glucose  Date Value Ref Range Status  01/18/2018 90 65 - 99 mg/dL Final  09/13/2017 94 65 - 99 mg/dL Final  05/16/2017 96 65 - 99 mg/dL Final    Skin cancer:  discussed atypical lesions Colorectal cancer: up to date Lung cancer:   Low Dose CT Chest recommended if Age 95-80 years, 30 pack-year currently smoking OR have quit w/in 15years. Patient does not qualify.   ECG: 2018   Patient Active Problem List   Diagnosis Date Noted  . Anterior knee pain 01/10/2015  . Symptomatic menopausal or female climacteric states 01/10/2015  . Otosclerosis of both ears 01/10/2015  . Allergic rhinitis 01/10/2015  . Hypothyroidism, adult 01/04/2015  . Dyslipidemia 01/04/2015  . Overweight (BMI 25.0-29.9) 01/04/2015  . Vitamin D deficiency 01/04/2015  . B12 deficiency 01/04/2015  . Left anterior knee pain 01/04/2015  . Bilateral hearing loss 01/04/2015  . Anxiety disorder 01/04/2015  . Personal history of healed traumatic fracture 01/29/2013    Past Surgical History:  Procedure Laterality Date  . EXTERNAL EAR SURGERY  Right    6th grade  . KNEE SURGERY Right     Family History  Problem Relation Age of Onset  . Hypertension Mother   . Alcohol abuse Father   . Cancer Father        Colon  . Breast cancer Neg Hx     Social History   Socioeconomic History  . Marital status: Married    Spouse name: Laveda Abbe   . Number of children: 3  . Years of education: Not on file  . Highest education level: High school graduate  Occupational History  . 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 Financial resource strain: Not hard at all  . Food insecurity:    Worry: Never true    Inability: Never true  . Transportation needs:    Medical: No    Non-medical: No  Tobacco Use  . Smoking status: Former Smoker    Packs/day: 0.25    Years: 10.00    Pack years: 2.50    Types: Cigarettes    Start date: 04/25/1983    Last attempt to quit: 04/24/1993    Years since quitting: 24.7  . Smokeless tobacco: Never Used  Substance and Sexual Activity  . Alcohol use: No    Alcohol/week: 0.0 standard drinks  . Drug use: No  . Sexual activity: Not Currently  Lifestyle  . Physical activity:    Days per week: 5 days    Minutes per session: 60 min  . Stress: Not at all  Relationships  . Social connections:    Talks on phone: Twice a week    Gets together: Twice a week    Attends religious service: Never    Active member of club or organization: No    Attends meetings of clubs or organizations: Never    Relationship status: Married  . Intimate partner violence:    Fear of current or ex partner: No    Emotionally abused: No    Physically abused: No    Forced sexual activity: No  Other Topics Concern  . Not on file  Social History Narrative  . Not on file     Current Outpatient Medications:  .  Cholecalciferol (VITAMIN D HIGH POTENCY) 1000 UNITS capsule, Take 1 tablet by mouth daily., Disp: , Rfl:  .  LORazepam (ATIVAN) 0.5 MG tablet, Take 1 tablet (0.5 mg total) by mouth as needed., Disp: 10  tablet, Rfl: 0 .  Methylcobalamin (B12-ACTIVE) 1 MG CHEW, Chew 1 tablet by mouth daily., Disp: , Rfl:  .  pravastatin (PRAVACHOL) 40 MG tablet, Take 1 tablet (40 mg total) by  mouth at bedtime., Disp: 90 tablet, Rfl: 1 .  SYNTHROID 100 MCG tablet, One daily and one a half twice a week, Disp: 96 tablet, Rfl: 0  No Known Allergies   ROS  Constitutional: Negative for fever or weight change.  Respiratory: Negative for cough and shortness of breath.   Cardiovascular: Negative for chest pain or palpitations.  Gastrointestinal: Negative for abdominal pain, no bowel changes.  Musculoskeletal: Negative for gait problem or joint swelling.  Skin: Negative for rash.  Neurological: Negative for dizziness or headache.  No other specific complaints in a complete review of systems (except as listed in HPI above).  Objective  Vitals:   01/22/18 1034  BP: 118/68  Pulse: 68  Resp: 14  Temp: 98.1 F (36.7 C)  TempSrc: Oral  SpO2: 99%  Weight: 153 lb 9.6 oz (69.7 kg)  Height: 5' 2.5" (1.588 m)    Body mass index is 27.65 kg/m.  Physical Exam  Constitutional: Patient appears well-developed and overweight. No distress.  HENT: Head: Normocephalic and atraumatic. Ears: right TM scar tissue and left side opaque TM  no erythema or effusion; Nose: Nose normal. Mouth/Throat: Oropharynx is clear and moist. No oropharyngeal exudate.  Eyes: Conjunctivae and EOM are normal. Pupils are equal, round, and reactive to light. No scleral icterus.  Neck: Normal range of motion. Neck supple. No JVD present. No thyromegaly present.  Cardiovascular: Normal rate, regular rhythm and normal heart sounds.  No murmur heard. No BLE edema. Pulmonary/Chest: Effort normal and breath sounds normal. No respiratory distress. Abdominal: Soft. Bowel sounds are normal, no distension. There is no tenderness. no masses Breast: no lumps or masses, no nipple discharge or rashes FEMALE GENITALIA:  External genitalia  normal External urethra normal Vaginal vault normal without discharge or lesions Cervix normal without discharge or lesions Bimanual exam normal without masses RECTAL: not done Musculoskeletal: Normal range of motion, no joint effusions. No gross deformities Neurological: he is alert and oriented to person, place, and time. No cranial nerve deficit. Coordination, balance, strength, speech and gait are normal.  Skin: Skin is warm and dry. No rash noted. No erythema.  Psychiatric: Patient has a normal mood and affect. behavior is normal. Judgment and thought content normal.  Recent Results (from the past 2160 hour(s))  TSH     Status: None   Collection Time: 11/09/17  7:39 AM  Result Value Ref Range   TSH 0.982 0.450 - 4.500 uIU/mL  CBC     Status: None   Collection Time: 01/18/18  7:16 AM  Result Value Ref Range   WBC 3.8 3.4 - 10.8 x10E3/uL   RBC 4.24 3.77 - 5.28 x10E6/uL   Hemoglobin 13.6 11.1 - 15.9 g/dL   Hematocrit 39.2 34.0 - 46.6 %   MCV 93 79 - 97 fL   MCH 32.1 26.6 - 33.0 pg   MCHC 34.7 31.5 - 35.7 g/dL   RDW 13.0 12.3 - 15.4 %   Platelets 234 150 - 450 x10E3/uL  Comprehensive metabolic panel     Status: Abnormal   Collection Time: 01/18/18  7:16 AM  Result Value Ref Range   Glucose 90 65 - 99 mg/dL   BUN 13 6 - 24 mg/dL   Creatinine, Ser 0.84 0.57 - 1.00 mg/dL   GFR calc non Af Amer 78 >59 mL/min/1.73   GFR calc Af Amer 90 >59 mL/min/1.73   BUN/Creatinine Ratio 15 9 - 23   Sodium 142 134 - 144 mmol/L   Potassium 4.7 3.5 -  5.2 mmol/L   Chloride 104 96 - 106 mmol/L   CO2 23 20 - 29 mmol/L   Calcium 9.6 8.7 - 10.2 mg/dL   Total Protein 6.5 6.0 - 8.5 g/dL   Albumin 4.7 3.5 - 5.5 g/dL   Globulin, Total 1.8 1.5 - 4.5 g/dL   Albumin/Globulin Ratio 2.6 (H) 1.2 - 2.2   Bilirubin Total 0.3 0.0 - 1.2 mg/dL   Alkaline Phosphatase 62 39 - 117 IU/L   AST 23 0 - 40 IU/L   ALT 26 0 - 32 IU/L  Hemoglobin A1c     Status: None   Collection Time: 01/18/18  7:16 AM  Result  Value Ref Range   Hgb A1c MFr Bld 5.4 4.8 - 5.6 %    Comment:          Prediabetes: 5.7 - 6.4          Diabetes: >6.4          Glycemic control for adults with diabetes: <7.0    Est. average glucose Bld gHb Est-mCnc 108 mg/dL  TSH     Status: None   Collection Time: 01/18/18  7:16 AM  Result Value Ref Range   TSH 1.300 0.450 - 4.500 uIU/mL  Vitamin B12     Status: None   Collection Time: 01/18/18  7:16 AM  Result Value Ref Range   Vitamin B-12 843 232 - 1,245 pg/mL  VITAMIN D 25 Hydroxy (Vit-D Deficiency, Fractures)     Status: None   Collection Time: 01/18/18  7:16 AM  Result Value Ref Range   Vit D, 25-Hydroxy 32.6 30.0 - 100.0 ng/mL    Comment: Vitamin D deficiency has been defined by the [place] and an Endocrine Society practice guideline as a level of serum 25-OH vitamin D less than 20 ng/mL (1,2). The Endocrine Society went on to further define vitamin D insufficiency as a level between 21 and 29 ng/mL (2). 1. IOM (Institute of Medicine). 2010. Dietary reference    intakes for calcium and D. [place]: The    [employer]. 2. Holick MF, Binkley Nash, Bischoff-Ferrari HA, et al.    Evaluation, treatment, and prevention of vitamin D    deficiency: an Endocrine Society clinical practice    guideline. JCEM. 2011 Jul; 96(7):1911-30.   Lipid panel     Status: Abnormal   Collection Time: 01/18/18  7:16 AM  Result Value Ref Range   Cholesterol, Total 206 (H) 100 - 199 mg/dL   Triglycerides 71 0 - 149 mg/dL   HDL 75 >39 mg/dL   VLDL Cholesterol Cal 14 5 - 40 mg/dL   LDL Calculated 117 (H) 0 - 99 mg/dL   Chol/HDL Ratio 2.7 0.0 - 4.4 ratio    Comment:                                   T. Chol/HDL Ratio                                             Men  Women                               1/2 Avg.Risk  3.4    3.3  Avg.Risk  5.0    4.4                                2X Avg.Risk  9.6    7.1                                 3X Avg.Risk 23.4   11.0       PHQ2/9: Depression screen [facility] 2/9 01/22/2018 01/15/2018 05/18/2017 01/16/2017 07/31/2016  Decreased Interest 0 0 0 0 0  Down, Depressed, Hopeless 0 0 0 0 0  PHQ - 2 Score 0 0 0 0 0  Altered sleeping 0 0 - - -  Tired, decreased energy 1 1 - - -  Change in appetite 0 0 - - -  Feeling bad or failure about yourself  0 0 - - -  Trouble concentrating 0 0 - - -  Moving slowly or fidgety/restless 0 0 - - -  Suicidal thoughts 0 0 - - -  PHQ-9 Score 1 1 - - -     Fall Risk: Fall Risk  01/22/2018 01/15/2018 09/14/2017 05/18/2017 01/16/2017  Falls in the past year? _0     Functional Status Survey: Is the patient deaf or have difficulty hearing?: Yes Does the patient have difficulty seeing, even when wearing glasses/contacts?: No Does the patient have difficulty concentrating, remembering, or making decisions?: No Does the patient have difficulty walking or climbing stairs?: No Does the patient have difficulty dressing or bathing?: No Does the patient have difficulty doing errands alone such as visiting a doctor's office or shopping?: No   Assessment & Plan  1. Well woman exam   2. Cervical cancer screening  - Pap IG and HPV (high risk) DNA detection   3. Breast cancer screening  - MM 3D SCREEN BREAST BILATERAL; Future  4. Ovarian failure  - DG Bone Density; Future  -USPSTF grade A and B recommendations reviewed with patient; age-appropriate recommendations, preventive care, screening tests, etc discussed and encouraged; healthy living encouraged; see AVS for patient education given to patient -Discussed importance of 150 minutes of physical activity weekly, eat two servings of fish weekly, eat one serving of tree nuts ( cashews, pistachios, pecans, almonds.Marland Kitchen) every other day, eat 6 servings of fruit/vegetables daily and drink plenty of water and avoid sweet beverages.

## 2018-01-22 NOTE — Patient Instructions (Signed)
Preventive Care 40-64 Years, Female Preventive care refers to lifestyle choices and visits with your health care provider that can promote health and wellness. What does preventive care include?  A yearly physical exam. This is also called an annual well check.  Dental exams once or twice a year.  Routine eye exams. Ask your health care provider how often you should have your eyes checked.  Personal lifestyle choices, including: ? Daily care of your teeth and gums. ? Regular physical activity. ? Eating a healthy diet. ? Avoiding tobacco and drug use. ? Limiting alcohol use. ? Practicing safe sex. ? Taking low-dose aspirin daily starting at age 56. ? Taking vitamin and mineral supplements as recommended by your health care provider. What happens during an annual well check? The services and screenings done by your health care provider during your annual well check will depend on your age, overall health, lifestyle risk factors, and family history of disease. Counseling Your health care provider may ask you questions about your:  Alcohol use.  Tobacco use.  Drug use.  Emotional well-being.  Home and relationship well-being.  Sexual activity.  Eating habits.  Work and work Statistician.  Method of birth control.  Menstrual cycle.  Pregnancy history.  Screening You may have the following tests or measurements:  Height, weight, and BMI.  Blood pressure.  Lipid and cholesterol levels. These may be checked every 5 years, or more frequently if you are over 56 years old.  Skin check.  Lung cancer screening. You may have this screening every year starting at age 56 if you have a 30-pack-year history of smoking and currently smoke or have quit within the past 15 years.  Fecal occult blood test (FOBT) of the stool. You may have this test every year starting at age 56.  Flexible sigmoidoscopy or colonoscopy. You may have a sigmoidoscopy every 5 years or a colonoscopy  every 10 years starting at age 56.  Hepatitis C blood test.  Hepatitis B blood test.  Sexually transmitted disease (STD) testing.  Diabetes screening. This is done by checking your blood sugar (glucose) after you have not eaten for a while (fasting). You may have this done every 1-3 years.  Mammogram. This may be done every 1-2 years. Talk to your health care provider about when you should start having regular mammograms. This may depend on whether you have a family history of breast cancer.  BRCA-related cancer screening. This may be done if you have a family history of breast, ovarian, tubal, or peritoneal cancers.  Pelvic exam and Pap test. This may be done every 3 years starting at age 56 Starting at age 36, this may be done every 5 years if you have a Pap test in combination with an HPV test.  Bone density scan. This is done to screen for osteoporosis. You may have this scan if you are at high risk for osteoporosis.  Discuss your test results, treatment options, and if necessary, the need for more tests with your health care provider. Vaccines Your health care provider may recommend certain vaccines, such as:  Influenza vaccine. This is recommended every year.  Tetanus, diphtheria, and acellular pertussis (Tdap, Td) vaccine. You may need a Td booster every 10 years.  Varicella vaccine. You may need this if you have not been vaccinated.  Zoster vaccine. You may need this after age 5.  Measles, mumps, and rubella (MMR) vaccine. You may need at least one dose of MMR if you were born in  1957 or later. You may also need a second dose.  Pneumococcal 13-valent conjugate (PCV13) vaccine. You may need this if you have certain conditions and were not previously vaccinated.  Pneumococcal polysaccharide (PPSV23) vaccine. You may need one or two doses if you smoke cigarettes or if you have certain conditions.  Meningococcal vaccine. You may need this if you have certain  conditions.  Hepatitis A vaccine. You may need this if you have certain conditions or if you travel or work in places where you may be exposed to hepatitis A.  Hepatitis B vaccine. You may need this if you have certain conditions or if you travel or work in places where you may be exposed to hepatitis B.  Haemophilus influenzae type b (Hib) vaccine. You may need this if you have certain conditions.  Talk to your health care provider about which screenings and vaccines you need and how often you need them. This information is not intended to replace advice given to you by your health care provider. Make sure you discuss any questions you have with your health care provider. Document Released: 05/07/2015 Document Revised: 12/29/2015 Document Reviewed: 02/09/2015 Elsevier Interactive Patient Education  2018 Elsevier Inc.  

## 2018-01-24 LAB — PAP IG AND HPV HIGH-RISK
HPV, high-risk: NEGATIVE
PAP SMEAR COMMENT: 0

## 2018-01-24 LAB — SPECIMEN STATUS REPORT

## 2018-02-08 ENCOUNTER — Encounter: Payer: Self-pay | Admitting: Dietician

## 2018-02-08 ENCOUNTER — Encounter: Payer: Managed Care, Other (non HMO) | Attending: Family Medicine | Admitting: Dietician

## 2018-02-08 VITALS — Ht 63.0 in | Wt 157.6 lb

## 2018-02-08 DIAGNOSIS — E782 Mixed hyperlipidemia: Secondary | ICD-10-CM

## 2018-02-08 DIAGNOSIS — E785 Hyperlipidemia, unspecified: Secondary | ICD-10-CM | POA: Insufficient documentation

## 2018-02-08 DIAGNOSIS — E663 Overweight: Secondary | ICD-10-CM | POA: Insufficient documentation

## 2018-02-08 DIAGNOSIS — Z6825 Body mass index (BMI) 25.0-25.9, adult: Secondary | ICD-10-CM | POA: Diagnosis not present

## 2018-02-08 DIAGNOSIS — Z713 Dietary counseling and surveillance: Secondary | ICD-10-CM | POA: Diagnosis not present

## 2018-02-08 NOTE — Progress Notes (Signed)
Medical Nutrition Therapy: Visit start time: 1130  end time: 1230  Assessment:  Diagnosis: hyperlipidemia Past medical history: hypothyroidism Psychosocial issues/ stress concerns: none; states she does eat more when stressed  Preferred learning method:  . Visual   Current weight: 157.6lbs Height: 5'3" Medications, supplements: reconciled list in medical record  Progress and evaluation: Patient reports that her weight tends to fluctuate between 140s - 150s, her goal is 140lbs, feels better at that weight. She would also like to reduce cholesterol medication if possible, and so would like help with dietary guidance for lowering cholesterol. She has participated in [employer] programs in the past, with successful weight loss, but did not like going to meetings. She also states that she does not cook other than quick, simple foods ie grilled chicken.   Physical activity: walk/ jog or exercise class 60 minutes, 4-5x a week  Dietary Intake:  Usual eating pattern includes 3 meals and 2-3 snacks per day. Dining out frequency: 1 meals per week.  Breakfast: banana with pretzels this week; prior 2 egg whites, 1/2 avocado, crackers (low weight watcher's points, gf) Snack: cheetos, fruit-- grapes Lunch: grilled chicken breast with low-cal dressing  Snack: same as am; occasionally 2 snacks Supper: maybe as late as 8pm cereal cheerios or frosted mini wheats Snack: none Beverages: water 2 bottles at work, 1 at home; coffee at work with flavored creamer  Nutrition Care Education: Topics covered: hyperlipidemia, weight control  Basic nutrition: basic food groups, appropriate nutrient balance, appropriate meal and snack schedule, general nutrition guidelines    Weight control: determining reasonable weight goal, limiting sources of extra fat or sugar, appropriate food portions, estimated kcal needs at 1400-1500 for weight loss, importance of balanced nutrition and balanced meal and snack options  that don't require cooking.  Hyperlipidemia: healthy and unhealthy fats, role of fiber, food sources of fiber, plant sterols   Nutritional Diagnosis:  [place]-2.2 Altered nutrition-related laboratory As related to hyperlipidemia.  As evidenced by patient with history of elevated blood lipids, currently controlled with medication. [place]-3.3 Overweight/obesity As related to excess calories, hypothryroidism.  As evidenced by patient with BMI of 27.8.  Intervention: Instruction as noted above.   Set goals with direction from patient.    Scheduled follow-up for early December per patient request.  Education Materials given:  . General diet guidelines for Cholesterol-lowering/ Heart health . Plate Planner with food lists . Snacking handout . Goals/ instructions   Learner/ who was taught:  . Patient    Level of understanding: Marland Kitchen Verbalizes/ demonstrates competency   Demonstrated degree of understanding via:   Teach back Learning barriers: . None  Willingness to learn/ readiness for change: . Eager, change in progress  Monitoring and Evaluation:  Dietary intake, exercise, blood lipids, and body weight      follow up: 03/29/18

## 2018-02-08 NOTE — Patient Instructions (Signed)
   Switch coffee creamer for lowfat milk or fat free half and half; can add sugar free flavoring syrup also.   Include healthy snacks, incorporate low-carb veggies and fruits along with nuts, lowfat or fat free cheese or yogurt, or peanut butter for protein.   Consider a larger snack/ small meal during the afternoon and then having a snack later in the evening to avoid high calorie intake at night.

## 2018-02-24 ENCOUNTER — Other Ambulatory Visit: Payer: Self-pay | Admitting: Family Medicine

## 2018-02-24 DIAGNOSIS — E785 Hyperlipidemia, unspecified: Secondary | ICD-10-CM

## 2018-03-26 ENCOUNTER — Ambulatory Visit
Admission: RE | Admit: 2018-03-26 | Discharge: 2018-03-26 | Disposition: A | Discharge status: Home / Self Care | Payer: Managed Care, Other (non HMO) | Source: Ambulatory Visit | Attending: Family Medicine | Admitting: Family Medicine

## 2018-03-26 DIAGNOSIS — E2839 Other primary ovarian failure: Secondary | ICD-10-CM | POA: Diagnosis present

## 2018-03-26 DIAGNOSIS — Z1239 Encounter for other screening for malignant neoplasm of breast: Secondary | ICD-10-CM | POA: Insufficient documentation

## 2018-03-27 ENCOUNTER — Encounter: Payer: Self-pay | Admitting: Family Medicine

## 2018-03-27 DIAGNOSIS — Z780 Asymptomatic menopausal state: Secondary | ICD-10-CM | POA: Insufficient documentation

## 2018-03-27 DIAGNOSIS — M810 Age-related osteoporosis without current pathological fracture: Secondary | ICD-10-CM

## 2018-03-29 ENCOUNTER — Ambulatory Visit: Payer: Managed Care, Other (non HMO) | Admitting: Dietician

## 2018-04-12 ENCOUNTER — Telehealth: Payer: Self-pay | Admitting: Family Medicine

## 2018-04-12 DIAGNOSIS — E039 Hypothyroidism, unspecified: Secondary | ICD-10-CM

## 2018-04-12 NOTE — Telephone Encounter (Signed)
Patient is inquiring if she can have a couple pills called into [facility] until she can get her full script through [facility]. Please Advise.

## 2018-04-12 NOTE — Telephone Encounter (Addendum)
Refill request for thyroid medication: Synthroid 100 mcg  Last Physical: 01/22/2018   Lab Results  Component Value Date   TSH 1.300 01/18/2018    Follow-ups on file. 07/16/2018  [facility] [address] [facility] she gets [facility] order rx.

## 2018-04-15 NOTE — Telephone Encounter (Signed)
Call in a week supply to local pharmacy, 90 days sent to [facility] 3 days ago

## 2018-04-15 NOTE — Addendum Note (Signed)
Addended by: Inda Coke on: 04/15/2018 03:01 PM   Modules accepted: Orders

## 2018-04-15 NOTE — Telephone Encounter (Signed)
Prescription has been called in 

## 2018-04-15 NOTE — Telephone Encounter (Signed)
Pt is calling to state that she requested a few pills be sent [facility]- [address] [facility].   the medication is: Synthroid 100 MCG tablet the PT is completely out.   Please Advise

## 2018-04-15 NOTE — Telephone Encounter (Signed)
Patient notified [facility]throid [facility] mcg [facility] per her request.

## 2018-04-22 NOTE — Telephone Encounter (Signed)
[facility] won't be able to get the script to her until 04/30/2018. Patient is requesting another short [facility] [address] Please advise.

## 2018-04-23 MED ORDER — SYNTHROID 100 MCG PO TABS: ORAL_TABLET | ORAL | 0 refills | Status: DC

## 2018-04-23 NOTE — Addendum Note (Signed)
Addended by: Hubbard Hartshorn on: 04/23/2018 06:50 AM   Modules accepted: Orders

## 2018-04-23 NOTE — Telephone Encounter (Signed)
[facility] tablets to [facility] [address]

## 2018-04-29 ENCOUNTER — Encounter: Payer: Self-pay | Admitting: Dietician

## 2018-04-29 NOTE — Progress Notes (Signed)
Have not heard back from patient to reschedule her cancelled appointment from 03/29/18. Sent letter to referring provider.

## 2018-07-16 ENCOUNTER — Ambulatory Visit: Payer: Managed Care, Other (non HMO) | Admitting: Family Medicine

## 2018-08-13 ENCOUNTER — Other Ambulatory Visit: Payer: Self-pay | Admitting: Family Medicine

## 2018-08-13 DIAGNOSIS — E785 Hyperlipidemia, unspecified: Secondary | ICD-10-CM

## 2018-08-19 ENCOUNTER — Telehealth: Payer: Self-pay

## 2018-08-19 ENCOUNTER — Other Ambulatory Visit: Payer: Self-pay | Admitting: Family Medicine

## 2018-08-19 DIAGNOSIS — E039 Hypothyroidism, unspecified: Secondary | ICD-10-CM

## 2018-08-19 NOTE — Telephone Encounter (Signed)
Copied from [place] [id number]. Topic: General - Inquiry >> Aug 19, 2018 10:12 AM Virl Axe D wrote: Reason for CRM: Pt called to request lab order for her thyroid check prior to her appointment on 08/23/18. Pt is requesting order be sent to her email [email or web address] as pt works for [employer] and can have it done there. Please advise. [phone]

## 2018-08-20 ENCOUNTER — Other Ambulatory Visit: Payer: Self-pay

## 2018-08-20 DIAGNOSIS — E039 Hypothyroidism, unspecified: Secondary | ICD-10-CM

## 2018-08-23 ENCOUNTER — Other Ambulatory Visit: Payer: Self-pay

## 2018-08-23 ENCOUNTER — Ambulatory Visit (INDEPENDENT_AMBULATORY_CARE_PROVIDER_SITE_OTHER): Payer: Managed Care, Other (non HMO) | Admitting: Family Medicine

## 2018-08-23 ENCOUNTER — Encounter: Payer: Self-pay | Admitting: Family Medicine

## 2018-08-23 VITALS — Temp 96.6°F | Wt 156.0 lb

## 2018-08-23 DIAGNOSIS — Z79899 Other long term (current) drug therapy: Secondary | ICD-10-CM

## 2018-08-23 DIAGNOSIS — E785 Hyperlipidemia, unspecified: Secondary | ICD-10-CM | POA: Diagnosis not present

## 2018-08-23 DIAGNOSIS — E538 Deficiency of other specified B group vitamins: Secondary | ICD-10-CM

## 2018-08-23 DIAGNOSIS — E559 Vitamin D deficiency, unspecified: Secondary | ICD-10-CM

## 2018-08-23 DIAGNOSIS — E039 Hypothyroidism, unspecified: Secondary | ICD-10-CM | POA: Diagnosis not present

## 2018-08-23 DIAGNOSIS — G4700 Insomnia, unspecified: Secondary | ICD-10-CM

## 2018-08-23 DIAGNOSIS — H8093 Unspecified otosclerosis, bilateral: Secondary | ICD-10-CM

## 2018-08-23 MED ORDER — LORAZEPAM 0.5 MG PO TABS: 0.5000 mg | ORAL_TABLET | ORAL | 0 refills | Status: DC | PRN

## 2018-08-23 NOTE — Progress Notes (Signed)
Name: Taylor Gilmore   MRN: [id number]    DOB: October 31, 1960   Date:08/23/2018       Progress Note  Subjective  Chief Complaint  Chief Complaint  Patient presents with  . Hypothyroidism    I connected with  Louretta Shorten  on 08/23/18 at 11:00 AM EDT by a video enabled telemedicine application and verified that I am speaking with the correct person using two identifiers.  I discussed the limitations of evaluation and management by telemedicine and the availability of in person appointments. The patient expressed understanding and agreed to proceed. Staff also discussed with the patient that there may be a patient responsible charge related to this service. Patient Location: home  Provider Location: [facility]   HPI  Dyslipidemia: sheis now on Pravastatin and tolerating well, while on Crestor she has leg pains. Last LDL was slightly above 100 but is at goal, HDL is excellent.She wants to have labs done next week.   LowB12: history of low B12 but it went high, she is now taking supplements three times weekly , last level was at goal  Vitamin D : taking otc supplementation and vitamin D is at goal, she would like to have it done next week   Insomnia: she takes Lorazepam very seldom, she still has a few pills left and last rx was written 02/2018, we will send rx today of lower dose at 0.5 mg since she usually just takes a quarter pill . She needs refill today   Hypothyroidism:shedenies constipation, palpitation, diarrhea, dysphagia.TSH was normal last time, we will recheck next week   Overweight: history of obesity, highest weight was 180 lbs but she has been eating healthy, no longer at weight watchers, she states since working from home, she is eating more frequent, she states she is frustrated about weight not going down below 150's, explained she is now only overweight not obese. 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 Stable, but has not been back to ENT if a long time. Unchanged   Patient Active Problem List   Diagnosis Date Noted  . Osteopenia after menopause 03/27/2018  . Anterior knee pain 01/10/2015  . Symptomatic menopausal or female climacteric states 01/10/2015  . Otosclerosis of both ears 01/10/2015  . Allergic rhinitis 01/10/2015  . Hypothyroidism, adult 01/04/2015  . Dyslipidemia 01/04/2015  . Overweight (BMI 25.0-29.9) 01/04/2015  . Vitamin D deficiency 01/04/2015  . B12 deficiency 01/04/2015  . Left anterior knee pain 01/04/2015  . Bilateral hearing loss 01/04/2015  . Anxiety disorder 01/04/2015  . Personal history of healed traumatic fracture 01/29/2013    Past Surgical History:  Procedure Laterality Date  . EXTERNAL EAR SURGERY Right    6th grade  . KNEE SURGERY Right     Family History  Problem Relation Age of Onset  . Hypertension Mother   . Alcohol abuse Father   . Cancer Father        Colon  . Breast cancer Neg Hx     Social History   Socioeconomic History  . Marital status: Married    Spouse name: Laveda Abbe   . Number of children: 3  . Years of education: Not on file  . Highest education level: High school graduate  Occupational History  . 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 Financial resource strain: Not hard at all  . Food insecurity:  Worry: Never true    Inability: Never true  . Transportation needs:    Medical: No    Non-medical: No  Tobacco Use  . Smoking status: Former Smoker    Packs/day: 0.25    Years: 10.00    Pack years: 2.50    Types: Cigarettes    Start date: 04/25/1983    Last attempt to quit: 04/24/1993    Years since quitting: 25.3  . Smokeless tobacco: Never Used  Substance and Sexual Activity  . Alcohol use: No    Alcohol/week: 0.0 standard drinks  . Drug use: No  . Sexual activity: Not Currently  Lifestyle  . Physical activity:    Days per week: 5 days    Minutes per session:  60 min  . Stress: Not at all  Relationships  . Social connections:    Talks on phone: Twice a week    Gets together: Twice a week    Attends religious service: Never    Active member of club or organization: No    Attends meetings of clubs or organizations: Never    Relationship status: Married  . Intimate partner violence:    Fear of current or ex partner: No    Emotionally abused: No    Physically abused: No    Forced sexual activity: No  Other Topics Concern  . Not on file  Social History Narrative  . Not on file     Current Outpatient Medications:  .  Cholecalciferol (VITAMIN D HIGH POTENCY) 1000 UNITS capsule, Take 1 tablet by mouth daily., Disp: , Rfl:  .  LORazepam (ATIVAN) 0.5 MG tablet, Take 1 tablet (0.5 mg total) by mouth as needed., Disp: 10 tablet, Rfl: 0 .  Methylcobalamin (B12-ACTIVE) 1 MG CHEW, Chew 1 tablet by mouth daily., Disp: , Rfl:  .  pravastatin (PRAVACHOL) 40 MG tablet, TAKE 1 TABLET BY MOUTH AT  BEDTIME, Disp: 90 tablet, Rfl: 1 .  SYNTHROID 100 MCG tablet, TAKE 1 TABLET BY MOUTH  MONDAY THROUGH SATURDAY AND 1 AND 1/2 TABLETS ON SUNDAY, Disp: 30 tablet, Rfl: 0  No Known Allergies  I personally reviewed active problem list, medication list, allergies, family history, social history with the patient/caregiver today.   ROS  Ten systems reviewed and is negative except as mentioned in HPI   Objective  Virtual encounter, vitals not obtained.  There is no height or weight on file to calculate BMI.  Physical Exam  Awake, alert and well groomed   PHQ2/9: Depression screen [facility] 2/9 08/23/2018 02/08/2018 01/22/2018 01/15/2018 05/18/2017  Decreased Interest 0 0 0 0 0  Down, Depressed, Hopeless 0 0 0 0 0  PHQ - 2 Score 0 0 0 0 0  Altered sleeping 0 - 0 0 -  Tired, decreased energy 0 - 1 1 -  Change in appetite 0 - 0 0 -  Feeling bad or failure about yourself  0 - 0 0 -  Trouble concentrating 0 - 0 0 -  Moving slowly or fidgety/restless 0 - 0 0 -   Suicidal thoughts 0 - 0 0 -  PHQ-9 Score 0 - 1 1 -   PHQ-2/9 Result is negative.    Fall Risk: Fall Risk  08/23/2018 02/08/2018 01/22/2018 01/15/2018 09/14/2017  Falls in the past year? 0 No No No No  Number falls in past yr: 0 - - - -  Injury with Fall? 0 - - - -     Assessment & Plan  1. Hypothyroidism, adult  -  TSH  2. Insomnia, unspecified type  - LORazepam (ATIVAN) 0.5 MG tablet; Take 1 tablet (0.5 mg total) by mouth as needed.  Dispense: 10 tablet; Refill: 0  3. Vitamin D deficiency  - VITAMIN D 25 Hydroxy (Vit-D Deficiency, Fractures)  4. Dyslipidemia  - Lipid panel  5. Low serum vitamin B12  - CBC with Differential/Platelet - B12 and Folate Panel  6. Otosclerosis of both ears   7. Long-term use of high-risk medication  - Comprehensive metabolic panel  I discussed the assessment and treatment plan with the patient. The patient was provided an opportunity to ask questions and all were answered. The patient agreed with the plan and demonstrated an understanding of the instructions.  The patient was advised to call back or seek an in-person evaluation if the symptoms worsen or if the condition fails to improve as anticipated.  I provided 25  minutes of non-face-to-face time during this encounter.

## 2018-09-06 ENCOUNTER — Other Ambulatory Visit: Payer: Self-pay | Admitting: Family Medicine

## 2018-09-06 DIAGNOSIS — E039 Hypothyroidism, unspecified: Secondary | ICD-10-CM

## 2018-11-29 ENCOUNTER — Other Ambulatory Visit: Payer: Self-pay | Admitting: Family Medicine

## 2018-11-29 DIAGNOSIS — E039 Hypothyroidism, unspecified: Secondary | ICD-10-CM

## 2019-01-27 ENCOUNTER — Ambulatory Visit (INDEPENDENT_AMBULATORY_CARE_PROVIDER_SITE_OTHER): Payer: Managed Care, Other (non HMO) | Admitting: Family Medicine

## 2019-01-27 ENCOUNTER — Encounter: Payer: Self-pay | Admitting: Family Medicine

## 2019-01-27 ENCOUNTER — Telehealth: Payer: Self-pay

## 2019-01-27 ENCOUNTER — Other Ambulatory Visit: Payer: Self-pay

## 2019-01-27 VITALS — BP 120/70 | HR 76 | Temp 96.2°F | Resp 16 | Ht 62.25 in | Wt 160.1 lb

## 2019-01-27 DIAGNOSIS — Z1211 Encounter for screening for malignant neoplasm of colon: Secondary | ICD-10-CM

## 2019-01-27 DIAGNOSIS — Z23 Encounter for immunization: Secondary | ICD-10-CM

## 2019-01-27 DIAGNOSIS — Z1231 Encounter for screening mammogram for malignant neoplasm of breast: Secondary | ICD-10-CM | POA: Diagnosis not present

## 2019-01-27 DIAGNOSIS — Z0000 Encounter for general adult medical examination without abnormal findings: Secondary | ICD-10-CM | POA: Diagnosis not present

## 2019-01-27 NOTE — Telephone Encounter (Signed)
Gastroenterology Pre-Procedure Review  Request Date: 03/14/19 Requesting Physician: Dr. Allen Norris  PATIENT REVIEW QUESTIONS: The patient responded to the following health history questions as indicated:    1. Are you having any GI issues? no 2. Do you have a personal history of Polyps? yes (1 noted in 2010 colonoscopy with Dr. Allen Norris) 3. Do you have a family history of Colon Cancer or Polyps? yes (father colon cancer) 4. Diabetes Mellitus? no 5. Joint replacements in the past 12 months?no 6. Major health problems in the past 3 months?no 7. Any artificial heart valves, MVP, or defibrillator?no    MEDICATIONS & ALLERGIES:    Patient reports the following regarding taking any anticoagulation/antiplatelet therapy:   Plavix, Coumadin, Eliquis, Xarelto, Lovenox, Pradaxa, Brilinta, or Effient? no Aspirin? no  Patient confirms/reports the following medications:  Current Outpatient Medications  Medication Sig Dispense Refill  . Cholecalciferol (VITAMIN D HIGH POTENCY) 1000 UNITS capsule Take 1 tablet by mouth daily.    Marland Kitchen LORazepam (ATIVAN) 0.5 MG tablet Take 1 tablet (0.5 mg total) by mouth as needed. 10 tablet 0  . Methylcobalamin (B12-ACTIVE) 1 MG CHEW Chew 1 tablet by mouth daily.    . pravastatin (PRAVACHOL) 40 MG tablet TAKE 1 TABLET BY MOUTH AT  BEDTIME 90 tablet 1  . SYNTHROID 100 MCG tablet TAKE 1 TABLET BY MOUTH  MONDAY THROUGH SATURDAY AND 1 AND 1/2 TABLETS ON SUNDAY 98 tablet 3   No current facility-administered medications for this visit.     Patient confirms/reports the following allergies:  No Known Allergies  No orders of the defined types were placed in this encounter.   AUTHORIZATION INFORMATION Primary Insurance: 1D#: Group #:  Secondary Insurance: 1D#: Group #:  SCHEDULE INFORMATION: Date: 03/14/19 Time: Location:[facility]

## 2019-01-27 NOTE — Patient Instructions (Signed)

## 2019-01-27 NOTE — Progress Notes (Signed)
Name: Taylor Gilmore   MRN: [id number]    DOB: [birth year]   Date:01/27/2019       Progress Note  Subjective  Chief Complaint  Chief Complaint  Patient presents with  . Annual Exam    HPI  Patient presents for annual CPE.  Diet: she has been eating healthy - gained weight because not as active Exercise: she has not been as active because of COVID-19, also helping with the grand-baby at home   USPSTF grade A and B recommendations    Office Visit from 01/27/2019 in [facility]  AUDIT-C Score  0     Depression: Phq 9 is  negative Depression screen [facility] 2/9 01/27/2019 08/23/2018 02/08/2018 01/22/2018 01/15/2018  Decreased Interest 0 0 0 0 0  Down, Depressed, Hopeless 0 0 0 0 0  PHQ - 2 Score 0 0 0 0 0  Altered sleeping 0 0 - 0 0  Tired, decreased energy 0 0 - 1 1  Change in appetite 0 0 - 0 0  Feeling bad or failure about yourself  0 0 - 0 0  Trouble concentrating 0 0 - 0 0  Moving slowly or fidgety/restless 0 0 - 0 0  Suicidal thoughts 0 0 - 0 0  PHQ-9 Score 0 0 - 1 1   Hypertension: BP Readings from Last 3 Encounters:  01/27/19 120/70  01/22/18 118/68  01/15/18 124/68   Obesity: Wt Readings from Last 3 Encounters:  01/27/19 160 lb 1.6 oz (72.6 kg)  08/23/18 156 lb (70.8 kg)  02/08/18 157 lb 9.6 oz (71.5 kg)   BMI Readings from Last 3 Encounters:  01/27/19 29.05 kg/m  08/23/18 27.63 kg/m  02/08/18 27.92 kg/m     Hep C Screening: 2014 STD testing and prevention (HIV/chl/gon/syphilis): N/A Intimate partner violence:negative screen  Sexual History/Pain during Intercourse: not currently  Menstrual History/LMP/Abnormal Bleeding: discussed post-menopausal bleeding  Incontinence Symptoms: she has intermittent urgency   Breast cancer:  - Last Mammogram: repeat 03/2020  - BRCA gene screening: N/A  Osteoporosis Screening: 2019   Cervical cancer screening: repeat in 2022   Skin cancer: discussed atypical lesions Colorectal cancer: 02/2019    Lung cancer:   Low Dose CT Chest recommended if Age 20-80 years, 30 pack-year currently smoking OR have quit w/in 15years. Patient does not qualify.   ECG:12/2016   Advanced Care Planning: A voluntary discussion about advance care planning including the explanation and discussion of advance directives.  Discussed health care proxy and Living will, and the patient was able to identify a health care proxy as husband .  Patient does not have a living will at present time.  Lipids: Lab Results  Component Value Date   CHOL 206 (H) 01/18/2018   CHOL 203 (H) 09/13/2017   CHOL 196 05/16/2017   Lab Results  Component Value Date   HDL 75 01/18/2018   HDL 73 09/13/2017   HDL 69 05/16/2017   Lab Results  Component Value Date   LDLCALC 117 (H) 01/18/2018   LDLCALC 111 (H) 09/13/2017   LDLCALC 109 (H) 05/16/2017   Lab Results  Component Value Date   TRIG 71 01/18/2018   TRIG 97 09/13/2017   TRIG 90 05/16/2017   Lab Results  Component Value Date   CHOLHDL 2.7 01/18/2018   CHOLHDL 2.8 09/13/2017   CHOLHDL 2.8 05/16/2017   No results found for: LDLDIRECT  Glucose: Glucose  Date Value Ref Range Status  01/18/2018 90 65 - 99 mg/dL Final  09/13/2017 94 65 - 99 mg/dL Final  05/16/2017 96 65 - 99 mg/dL Final    Patient Active Problem List   Diagnosis Date Noted  . Osteopenia after menopause 03/27/2018  . Anterior knee pain 01/10/2015  . Symptomatic menopausal or female climacteric states 01/10/2015  . Otosclerosis of both ears 01/10/2015  . Allergic rhinitis 01/10/2015  . Hypothyroidism, adult 01/04/2015  . Dyslipidemia 01/04/2015  . Overweight (BMI 25.0-29.9) 01/04/2015  . Vitamin D deficiency 01/04/2015  . B12 deficiency 01/04/2015  . Left anterior knee pain 01/04/2015  . Bilateral hearing loss 01/04/2015  . Anxiety disorder 01/04/2015  . Personal history of healed traumatic fracture 01/29/2013    Past Surgical History:  Procedure Laterality Date  . EXTERNAL EAR  SURGERY Right    6th grade  . KNEE SURGERY Right     Family History  Problem Relation Age of Onset  . Hypertension Mother   . Alcohol abuse Father   . Cancer Father        Colon  . Breast cancer Neg Hx     Social History   Socioeconomic History  . Marital status: Married    Spouse name: Laveda Abbe   . Number of children: 3  . Years of education: Not on file  . Highest education level: High school graduate  Occupational History  . 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 Financial resource strain: Not hard at all  . Food insecurity    Worry: Never true    Inability: Never true  . Transportation needs    Medical: No    Non-medical: No  Tobacco Use  . Smoking status: Former Smoker    Packs/day: 0.25    Years: 10.00    Pack years: 2.50    Types: Cigarettes    Start date: 04/25/1983    Quit date: 04/24/1993    Years since quitting: 25.7  . Smokeless tobacco: Never Used  Substance and Sexual Activity  . Alcohol use: No    Alcohol/week: 0.0 standard drinks  . Drug use: No  . Sexual activity: Not Currently  Lifestyle  . Physical activity    Days per week: 5 days    Minutes per session: 60 min  . Stress: Not at all  Relationships  . Social Herbalist on phone: Twice a week    Gets together: Twice a week    Attends religious service: Never    Active member of club or organization: No    Attends meetings of clubs or organizations: Never    Relationship status: Married  . Intimate partner violence    Fear of current or ex partner: No    Emotionally abused: No    Physically abused: No    Forced sexual activity: No  Other Topics Concern  . Not on file  Social History Narrative   Daughter, son-in-law and grandson moved in with them 04/2018 to build a house but with COVID-19 their plans has been delayed      Current Outpatient Medications:  .  Cholecalciferol (VITAMIN D HIGH POTENCY) 1000 UNITS capsule, Take 1 tablet by mouth daily., Disp:  , Rfl:  .  LORazepam (ATIVAN) 0.5 MG tablet, Take 1 tablet (0.5 mg total) by mouth as needed., Disp: 10 tablet, Rfl: 0 .  Methylcobalamin (B12-ACTIVE) 1 MG CHEW, Chew 1 tablet by mouth daily., Disp: , Rfl:  .  pravastatin (PRAVACHOL) 40 MG tablet, TAKE 1 TABLET BY MOUTH AT  BEDTIME, Disp: 90 tablet, Rfl: 1 .  SYNTHROID 100 MCG tablet, TAKE 1 TABLET BY MOUTH  MONDAY THROUGH SATURDAY AND 1 AND 1/2 TABLETS ON SUNDAY, Disp: 98 tablet, Rfl: 3  No Known Allergies   ROS  Constitutional: Negative for fever or weight change.  Respiratory: Negative for cough and shortness of breath.   Cardiovascular: Negative for chest pain or palpitations.  Gastrointestinal: Negative for abdominal pain, no bowel changes.  Musculoskeletal: Negative for gait problem or joint swelling.  Skin: Negative for rash.  Neurological: Negative for dizziness or headache.  No other specific complaints in a complete review of systems (except as listed in HPI above).  Objective  Vitals:   01/27/19 0835  BP: 120/70  Pulse: 76  Resp: 16  Temp: (!) 96.2 F (35.7 C)  TempSrc: Temporal  SpO2: 97%  Weight: 160 lb 1.6 oz (72.6 kg)  Height: 5' 2.25" (1.581 m)    Body mass index is 29.05 kg/m.  Physical Exam  Constitutional: Patient appears well-developed and well-nourished. No distress.  HENT: Head: Normocephalic and atraumatic. Ears: B TMs ok, no erythema or effusion; Nose: Nose normal. Mouth/Throat: not done, wearing a mask Eyes: Conjunctivae and EOM are normal. Pupils are equal, round, and reactive to light. No scleral icterus.  Neck: Normal range of motion. Neck supple. No JVD present. No thyromegaly present.  Cardiovascular: Normal rate, regular rhythm and normal heart sounds.  No murmur heard. No BLE edema. Pulmonary/Chest: Effort normal and breath sounds normal. No respiratory distress. Abdominal: Soft. Bowel sounds are normal, no distension. There is no tenderness. no masses Breast: no lumps or masses, no  nipple discharge or rashes FEMALE GENITALIA:  Not done RECTAL: not done Musculoskeletal: Normal range of motion, no joint effusions. No gross deformities Neurological: he is alert and oriented to person, place, and time. No cranial nerve deficit. Coordination, balance, strength, speech and gait are normal.  Skin: Skin is warm and dry. No rash noted. No erythema.  Psychiatric: Patient has a normal mood and affect. behavior is normal. Judgment and thought content normal.  Fall Risk: Fall Risk  01/27/2019 08/23/2018 02/08/2018 01/22/2018 01/15/2018  Falls in the past year? 0 0 No No No  Number falls in past yr: 0 0 - - -  Injury with Fall? 0 0 - - -     Functional Status Survey: Is the patient deaf or have difficulty hearing?: Yes Does the patient have difficulty seeing, even when wearing glasses/contacts?: No Does the patient have difficulty concentrating, remembering, or making decisions?: No Does the patient have difficulty walking or climbing stairs?: No Does the patient have difficulty dressing or bathing?: No Does the patient have difficulty doing errands alone such as visiting a doctor's office or shopping?: No   Assessment & Plan   1. Need for immunization against influenza  - Flu Vaccine QUAD 36+ mos IM  2. Need for Tdap vaccination  - Tdap vaccine greater than or equal to 7yo IM  3. Well adult exam  She will start exercising again   4. Colon cancer screening  - Ambulatory referral to Gastroenterology -USPSTF grade A and B recommendations reviewed with patient; age-appropriate recommendations, preventive care, screening tests, etc discussed and encouraged; healthy living encouraged; see AVS for patient education given to patient -Discussed importance of 150 minutes of physical activity weekly, eat two servings of fish weekly, eat one serving of tree nuts ( cashews, pistachios, pecans, almonds.Marland Kitchen) every other day, eat 6 servings of fruit/vegetables daily and drink plenty  of water and avoid sweet beverages.

## 2019-01-31 ENCOUNTER — Other Ambulatory Visit: Payer: Self-pay | Admitting: Family Medicine

## 2019-01-31 DIAGNOSIS — E785 Hyperlipidemia, unspecified: Secondary | ICD-10-CM

## 2019-02-01 NOTE — Telephone Encounter (Signed)
Requested medication (s) are due for refill today: yes  Requested medication (s) are on the active medication list: yes  Last refill:  08/14/2018  Future visit scheduled: yes  Notes to clinic:  antilipid statins failed and total normal cholesterol not within 360 days  Requested Prescriptions  Pending Prescriptions Disp Refills   pravastatin (PRAVACHOL) 40 MG tablet [Pharmacy Med Name: PRAVASTATIN SOD 40MG  TABLET] 90 tablet 3    Sig: TAKE 1 TABLET BY MOUTH AT  BEDTIME     Cardiovascular:  Antilipid - Statins Failed - 01/31/2019  9:14 PM      Failed - Total Cholesterol in normal range and within 360 days    Cholesterol, Total  Date Value Ref Range Status  01/18/2018 206 (H) 100 - 199 mg/dL Final         Failed - LDL in normal range and within 360 days    LDL Calculated  Date Value Ref Range Status  01/18/2018 117 (H) 0 - 99 mg/dL Final         Failed - HDL in normal range and within 360 days    HDL  Date Value Ref Range Status  01/18/2018 75 >39 mg/dL Final         Failed - Triglycerides in normal range and within 360 days    Triglycerides  Date Value Ref Range Status  01/18/2018 71 0 - 149 mg/dL Final         Passed - Patient is not pregnant      Passed - Valid encounter within last 12 months    Recent Outpatient Visits          5 days ago Well adult exam   [facility] Steele Sizer, MD   5 months ago Hypothyroidism, adult   [facility] Steele Sizer, MD   1 year ago Well woman exam   [facility] Steele Sizer, MD   1 year ago Hypothyroidism, adult   [facility] Steele Sizer, MD   1 year ago Dyslipidemia   [facility] Steele Sizer, MD      Future Appointments            In 3 weeks Steele Sizer, MD [facility], [facility]

## 2019-02-03 ENCOUNTER — Other Ambulatory Visit: Payer: Self-pay | Admitting: Family Medicine

## 2019-02-04 LAB — LIPID PANEL
Chol/HDL Ratio: 2.9 ratio (ref 0.0–4.4)
Cholesterol, Total: 178 mg/dL (ref 100–199)
HDL: 61 mg/dL (ref >39)
LDL Chol Calc (NIH): 95 mg/dL (ref 0–99)
Triglycerides: 127 mg/dL (ref 0–149)
VLDL Cholesterol Cal: 22 mg/dL (ref 5–40)

## 2019-02-04 LAB — CBC WITH DIFFERENTIAL/PLATELET
Basophils Absolute: 0 10*3/uL (ref 0.0–0.2)
Basos: 1 %
EOS (ABSOLUTE): 0.2 10*3/uL (ref 0.0–0.4)
Eos: 6 %
Hematocrit: 40.1 % (ref 34.0–46.6)
Hemoglobin: 13.6 g/dL (ref 11.1–15.9)
Immature Grans (Abs): 0 10*3/uL (ref 0.0–0.1)
Immature Granulocytes: 0 %
Lymphocytes Absolute: 1.1 10*3/uL (ref 0.7–3.1)
Lymphs: 26 %
MCH: 31.1 pg (ref 26.6–33.0)
MCHC: 33.9 g/dL (ref 31.5–35.7)
MCV: 92 fL (ref 79–97)
Monocytes Absolute: 0.4 10*3/uL (ref 0.1–0.9)
Monocytes: 9 %
Neutrophils Absolute: 2.4 10*3/uL (ref 1.4–7.0)
Neutrophils: 58 %
Platelets: 196 10*3/uL (ref 150–450)
RBC: 4.38 x10E6/uL (ref 3.77–5.28)
RDW: 12.5 % (ref 11.7–15.4)
WBC: 4.1 10*3/uL (ref 3.4–10.8)

## 2019-02-04 LAB — COMPREHENSIVE METABOLIC PANEL
ALT: 19 IU/L (ref 0–32)
AST: 22 IU/L (ref 0–40)
Albumin/Globulin Ratio: 3.3 — ABNORMAL HIGH (ref 1.2–2.2)
Albumin: 4.6 g/dL (ref 3.8–4.9)
Alkaline Phosphatase: 71 IU/L (ref 39–117)
BUN/Creatinine Ratio: 18 (ref 9–23)
BUN: 14 mg/dL (ref 6–24)
Bilirubin Total: 0.3 mg/dL (ref 0.0–1.2)
CO2: 24 mmol/L (ref 20–29)
Calcium: 9.2 mg/dL (ref 8.7–10.2)
Chloride: 104 mmol/L (ref 96–106)
Creatinine, Ser: 0.79 mg/dL (ref 0.57–1.00)
GFR calc Af Amer: 96 mL/min/{1.73_m2} (ref >59)
GFR calc non Af Amer: 83 mL/min/{1.73_m2} (ref >59)
Globulin, Total: 1.4 g/dL — ABNORMAL LOW (ref 1.5–4.5)
Glucose: 94 mg/dL (ref 65–99)
Potassium: 4.3 mmol/L (ref 3.5–5.2)
Sodium: 140 mmol/L (ref 134–144)
Total Protein: 6 g/dL (ref 6.0–8.5)

## 2019-02-04 LAB — B12 AND FOLATE PANEL
Folate: 9.5 ng/mL (ref >3.0)
Vitamin B-12: 1186 pg/mL (ref 232–1245)

## 2019-02-04 LAB — VITAMIN D 25 HYDROXY (VIT D DEFICIENCY, FRACTURES): Vit D, 25-Hydroxy: 34.7 ng/mL (ref 30.0–100.0)

## 2019-02-04 LAB — TSH: TSH: 0.164 u[IU]/mL — ABNORMAL LOW (ref 0.450–4.500)

## 2019-02-26 ENCOUNTER — Ambulatory Visit: Payer: Managed Care, Other (non HMO) | Admitting: Family Medicine

## 2019-02-27 ENCOUNTER — Encounter: Payer: Self-pay | Admitting: Family Medicine

## 2019-02-27 ENCOUNTER — Ambulatory Visit (INDEPENDENT_AMBULATORY_CARE_PROVIDER_SITE_OTHER): Payer: Managed Care, Other (non HMO) | Admitting: Family Medicine

## 2019-02-27 VITALS — Wt 157.2 lb

## 2019-02-27 DIAGNOSIS — E559 Vitamin D deficiency, unspecified: Secondary | ICD-10-CM

## 2019-02-27 DIAGNOSIS — E538 Deficiency of other specified B group vitamins: Secondary | ICD-10-CM | POA: Diagnosis not present

## 2019-02-27 DIAGNOSIS — G4700 Insomnia, unspecified: Secondary | ICD-10-CM

## 2019-02-27 DIAGNOSIS — H8093 Unspecified otosclerosis, bilateral: Secondary | ICD-10-CM

## 2019-02-27 DIAGNOSIS — H900 Conductive hearing loss, bilateral: Secondary | ICD-10-CM

## 2019-02-27 DIAGNOSIS — E785 Hyperlipidemia, unspecified: Secondary | ICD-10-CM

## 2019-02-27 DIAGNOSIS — E039 Hypothyroidism, unspecified: Secondary | ICD-10-CM

## 2019-02-27 MED ORDER — LORAZEPAM 0.5 MG PO TABS: 0.5000 mg | ORAL_TABLET | ORAL | 0 refills | Status: DC | PRN

## 2019-02-27 MED ORDER — SYNTHROID 100 MCG PO TABS: ORAL_TABLET | ORAL | 3 refills | Status: DC

## 2019-02-27 NOTE — Progress Notes (Signed)
Name: Taylor Gilmore   MRN: [id number]    DOB: 1961-02-02   Date:02/27/2019       Progress Note  Subjective  Chief Complaint  Chief Complaint  Patient presents with  . Follow-up  . Thyroid Problem    I connected with  Louretta Shorten  on 02/27/19 at 10:40 AM EST by a video enabled telemedicine application and verified that I am speaking with the correct person using two identifiers.  I discussed the limitations of evaluation and management by telemedicine and the availability of in person appointments. The patient expressed understanding and agreed to proceed. Staff also discussed with the patient that there may be a patient responsible charge related to this service. Patient Location: at home  Provider Location: [facility]    HPI  Dyslipidemia: sheis now on Pravastatin and tolerating well, while on Crestor she has leg pains. Last LDL was slightly above 100 but is at goal, HDL is excellent. Reviewed labs done two weeks ago.    LowB12: she is now taking supplements three times weekly , last level was at goal  Vitamin D : taking otc supplementation and vitamin D is at goal, she would like to have it done next week   Insomnia: she takes Lorazepam very seldom, she would like 10 more pills to last 6 months, takes it prn only   Hypothyroidism:shedenies change in bowel movements, , palpitation,  dysphagia.TSH was suppressed, we will change from 100 mcg daily and one and half Sundays to once daily   Overweight: history of obesity, highest weight was 180 lbs but she has been eating healthy,no longer at weight watchers, she states since working from home, she is eating more frequent, she states she is frustrated about weight not going down below 150's, explained she is now only overweight not obese. 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 Stable, but has not been back to ENT if a long time.Stable   Patient Active Problem List   Diagnosis Date Noted  . Osteopenia after menopause 03/27/2018  . Anterior knee pain 01/10/2015  . Symptomatic menopausal or female climacteric states 01/10/2015  . Otosclerosis of both ears 01/10/2015  . Allergic rhinitis 01/10/2015  . Hypothyroidism, adult 01/04/2015  . Dyslipidemia 01/04/2015  . Overweight (BMI 25.0-29.9) 01/04/2015  . Vitamin D deficiency 01/04/2015  . B12 deficiency 01/04/2015  . Left anterior knee pain 01/04/2015  . Bilateral hearing loss 01/04/2015  . Anxiety disorder 01/04/2015  . Personal history of healed traumatic fracture 01/29/2013    Past Surgical History:  Procedure Laterality Date  . EXTERNAL EAR SURGERY Right    6th grade  . KNEE SURGERY Right     Family History  Problem Relation Age of Onset  . Hypertension Mother   . Alcohol abuse Father   . Cancer Father        Colon  . Breast cancer Neg Hx     Social History   Socioeconomic History  . Marital status: Married    Spouse name: Laveda Abbe   . Number of children: 3  . Years of education: Not on file  . Highest education level: High school graduate  Occupational History  . 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 Financial resource strain: Not hard at all  . Food insecurity    Worry: Never true    Inability: Never true  . Transportation needs  Medical: No    Non-medical: No  Tobacco Use  . Smoking status: Former Smoker    Packs/day: 0.25    Years: 10.00    Pack years: 2.50    Types: Cigarettes    Start date: 04/25/1983    Quit date: 04/24/1993    Years since quitting: 25.8  . Smokeless tobacco: Never Used  Substance and Sexual Activity  . Alcohol use: No    Alcohol/week: 0.0 standard drinks  . Drug use: No  . Sexual activity: Not Currently  Lifestyle  . Physical activity    Days per week: 3 days    Minutes per session: 40 min  . Stress: Not at all  Relationships  . Social Herbalist on phone: Twice a week     Gets together: Twice a week    Attends religious service: Never    Active member of club or organization: No    Attends meetings of clubs or organizations: Never    Relationship status: Married  . Intimate partner violence    Fear of current or ex partner: No    Emotionally abused: No    Physically abused: No    Forced sexual activity: No  Other Topics Concern  . Not on file  Social History Narrative   Daughter, son-in-law and grandson moved in with them 04/2018 to build a house but with COVID-19 their plans has been delayed      Current Outpatient Medications:  .  Cholecalciferol (VITAMIN D HIGH POTENCY) 1000 UNITS capsule, Take 1 tablet by mouth daily., Disp: , Rfl:  .  LORazepam (ATIVAN) 0.5 MG tablet, Take 1 tablet (0.5 mg total) by mouth as needed., Disp: 10 tablet, Rfl: 0 .  Methylcobalamin (B12-ACTIVE) 1 MG CHEW, Chew 1 tablet by mouth daily., Disp: , Rfl:  .  pravastatin (PRAVACHOL) 40 MG tablet, TAKE 1 TABLET BY MOUTH AT  BEDTIME, Disp: 90 tablet, Rfl: 3 .  SYNTHROID 100 MCG tablet, Take one daily, new direction, Disp: 98 tablet, Rfl: 3  No Known Allergies  I personally reviewed active problem list, medication list, allergies, family history, social history, health maintenance with the patient/caregiver today.   ROS  Ten systems reviewed and is negative except as mentioned in HPI   Objective  Virtual encounter, vitals not obtained.  Body mass index is 28.52 kg/m.  Physical Exam  Awake, alert and oriented  PHQ2/9: Depression screen [facility] 2/9 02/27/2019 01/27/2019 08/23/2018 02/08/2018 01/22/2018  Decreased Interest 0 0 0 0 0  Down, Depressed, Hopeless 0 0 0 0 0  PHQ - 2 Score 0 0 0 0 0  Altered sleeping 0 0 0 - 0  Tired, decreased energy 0 0 0 - 1  Change in appetite 0 0 0 - 0  Feeling bad or failure about yourself  0 0 0 - 0  Trouble concentrating 0 0 0 - 0  Moving slowly or fidgety/restless 0 0 0 - 0  Suicidal thoughts 0 0 0 - 0  PHQ-9 Score 0 0 0 - 1    PHQ-2/9 Result is negative.    Fall Risk: Fall Risk  02/27/2019 01/27/2019 08/23/2018 02/08/2018 01/22/2018  Falls in the past year? 0 0 0 No No  Number falls in past yr: 0 0 0 - -  Injury with Fall? 0 0 0 - -     Assessment & Plan   1. Hypothyroidism, adult  - SYNTHROID 100 MCG tablet; Take one daily, new direction  Dispense: 98  tablet; Refill: 3  She will go down to one pill daily and recheck level in 6 weeks  TSH  2. Insomnia, unspecified type  - LORazepam (ATIVAN) 0.5 MG tablet; Take 1 tablet (0.5 mg total) by mouth as needed.  Dispense: 10 tablet; Refill: 0  3. Vitamin D deficiency  Continue otc supplementation   4. Low serum vitamin B12  Continue three times a week supplementation   5. Dyslipidemia  Continue statin therapy   6. Otosclerosis of both ears   7. Conductive hearing loss, bilateral   I discussed the assessment and treatment plan with the patient. The patient was provided an opportunity to ask questions and all were answered. The patient agreed with the plan and demonstrated an understanding of the instructions.  The patient was advised to call back or seek an in-person evaluation if the symptoms worsen or if the condition fails to improve as anticipated.  I provided 25 minutes of non-face-to-face time during this encounter.

## 2019-03-06 ENCOUNTER — Other Ambulatory Visit: Payer: Self-pay

## 2019-03-06 ENCOUNTER — Encounter: Payer: Self-pay | Admitting: *Deleted

## 2019-03-11 ENCOUNTER — Other Ambulatory Visit: Payer: Self-pay

## 2019-03-11 ENCOUNTER — Other Ambulatory Visit
Admission: RE | Admit: 2019-03-11 | Discharge: 2019-03-11 | Disposition: A | Discharge status: Home / Self Care | Payer: Managed Care, Other (non HMO) | Source: Ambulatory Visit | Attending: Gastroenterology | Admitting: Gastroenterology

## 2019-03-11 DIAGNOSIS — Z01812 Encounter for preprocedural laboratory examination: Secondary | ICD-10-CM | POA: Diagnosis not present

## 2019-03-11 DIAGNOSIS — Z20828 Contact with and (suspected) exposure to other viral communicable diseases: Secondary | ICD-10-CM | POA: Insufficient documentation

## 2019-03-11 LAB — SARS CORONAVIRUS 2 (TAT 6-24 HRS): SARS Coronavirus 2: NEGATIVE

## 2019-03-13 NOTE — Discharge Instructions (Signed)

## 2019-03-13 NOTE — Anesthesia Preprocedure Evaluation (Addendum)
Anesthesia Evaluation  Patient identified by MRN, date of birth, ID band Patient awake    Reviewed: Allergy & Precautions, NPO status , Patient's Chart, lab work & pertinent test results  History of Anesthesia Complications Negative for: history of anesthetic complications  Airway Mallampati: II  TM Distance: >3 FB Neck ROM: Full    Dental   Pulmonary former smoker,    breath sounds clear to auscultation       Cardiovascular (-) angina(-) DOE  Rhythm:Regular Rate:Normal   HLD   Neuro/Psych PSYCHIATRIC DISORDERS Anxiety    GI/Hepatic neg GERD  ,  Endo/Other  Hypothyroidism   Renal/GU      Musculoskeletal   Abdominal   Peds  Hematology   Anesthesia Other Findings   Reproductive/Obstetrics                            Anesthesia Physical Anesthesia Plan  ASA: II  Anesthesia Plan: General   Post-op Pain Management:    Induction: Intravenous  PONV Risk Score and Plan: 3 and Propofol infusion, TIVA and Treatment may vary due to age or medical condition  Airway Management Planned: Natural Airway and Nasal Cannula  Additional Equipment:   Intra-op Plan:   Post-operative Plan:   Informed Consent: I have reviewed the patients History and Physical, chart, labs and discussed the procedure including the risks, benefits and alternatives for the proposed anesthesia with the patient or authorized representative who has indicated his/her understanding and acceptance.       Plan Discussed with: CRNA and Anesthesiologist  Anesthesia Plan Comments:         Anesthesia Quick Evaluation

## 2019-03-14 ENCOUNTER — Encounter
Admission: RE | Disposition: A | Discharge status: Home / Self Care | Payer: Self-pay | Source: Home / Self Care | Attending: Gastroenterology

## 2019-03-14 ENCOUNTER — Ambulatory Visit: Payer: Managed Care, Other (non HMO) | Admitting: Anesthesiology

## 2019-03-14 ENCOUNTER — Ambulatory Visit
Admission: RE | Admit: 2019-03-14 | Discharge: 2019-03-14 | Disposition: A | Discharge status: Home / Self Care | Payer: Managed Care, Other (non HMO) | Attending: Gastroenterology | Admitting: Gastroenterology

## 2019-03-14 ENCOUNTER — Other Ambulatory Visit: Payer: Self-pay

## 2019-03-14 DIAGNOSIS — F419 Anxiety disorder, unspecified: Secondary | ICD-10-CM | POA: Insufficient documentation

## 2019-03-14 DIAGNOSIS — E785 Hyperlipidemia, unspecified: Secondary | ICD-10-CM | POA: Diagnosis not present

## 2019-03-14 DIAGNOSIS — Z6827 Body mass index (BMI) 27.0-27.9, adult: Secondary | ICD-10-CM | POA: Diagnosis not present

## 2019-03-14 DIAGNOSIS — E538 Deficiency of other specified B group vitamins: Secondary | ICD-10-CM | POA: Insufficient documentation

## 2019-03-14 DIAGNOSIS — E663 Overweight: Secondary | ICD-10-CM | POA: Insufficient documentation

## 2019-03-14 DIAGNOSIS — E559 Vitamin D deficiency, unspecified: Secondary | ICD-10-CM | POA: Diagnosis not present

## 2019-03-14 DIAGNOSIS — Z800 Family history of malignant neoplasm of digestive organs: Secondary | ICD-10-CM

## 2019-03-14 DIAGNOSIS — Z79890 Hormone replacement therapy (postmenopausal): Secondary | ICD-10-CM | POA: Diagnosis not present

## 2019-03-14 DIAGNOSIS — D122 Benign neoplasm of ascending colon: Secondary | ICD-10-CM | POA: Diagnosis not present

## 2019-03-14 DIAGNOSIS — Z87891 Personal history of nicotine dependence: Secondary | ICD-10-CM | POA: Insufficient documentation

## 2019-03-14 DIAGNOSIS — E039 Hypothyroidism, unspecified: Secondary | ICD-10-CM | POA: Insufficient documentation

## 2019-03-14 DIAGNOSIS — K635 Polyp of colon: Secondary | ICD-10-CM | POA: Diagnosis not present

## 2019-03-14 DIAGNOSIS — Z8249 Family history of ischemic heart disease and other diseases of the circulatory system: Secondary | ICD-10-CM | POA: Diagnosis not present

## 2019-03-14 DIAGNOSIS — Z79899 Other long term (current) drug therapy: Secondary | ICD-10-CM | POA: Diagnosis not present

## 2019-03-14 DIAGNOSIS — Z1211 Encounter for screening for malignant neoplasm of colon: Secondary | ICD-10-CM | POA: Diagnosis not present

## 2019-03-14 DIAGNOSIS — K648 Other hemorrhoids: Secondary | ICD-10-CM | POA: Diagnosis not present

## 2019-03-14 HISTORY — PX: POLYPECTOMY: SHX5525

## 2019-03-14 HISTORY — PX: COLONOSCOPY WITH PROPOFOL: SHX5780

## 2019-03-14 HISTORY — DX: Hypothyroidism, unspecified: E03.9

## 2019-03-14 SURGERY — COLONOSCOPY WITH PROPOFOL
Anesthesia: General | Site: Rectum

## 2019-03-14 MED ORDER — PROPOFOL 10 MG/ML IV BOLUS
INTRAVENOUS | Status: DC | PRN
  Administered 2019-03-14: 100 mg via INTRAVENOUS
  Administered 2019-03-14 (×2): 50 mg via INTRAVENOUS

## 2019-03-14 MED ORDER — LACTATED RINGERS IV SOLN
100.0000 mL/h | INTRAVENOUS | Status: DC
  Administered 2019-03-14: 09:00:00 via INTRAVENOUS

## 2019-03-14 MED ORDER — LIDOCAINE HCL (CARDIAC) PF 100 MG/5ML IV SOSY
PREFILLED_SYRINGE | INTRAVENOUS | Status: DC | PRN
  Administered 2019-03-14: 40 mg via INTRAVENOUS

## 2019-03-14 MED ORDER — STERILE WATER FOR IRRIGATION IR SOLN
Status: DC | PRN
  Administered 2019-03-14: 50 mL

## 2019-03-14 SURGICAL SUPPLY — 6 items
CANISTER SUCT 1200ML W/VALVE (MISCELLANEOUS) ×3 IMPLANT
FORCEPS BIOP RAD 4 LRG CAP 4 (CUTTING FORCEPS) ×2 IMPLANT
GOWN CVR UNV OPN BCK APRN NK (MISCELLANEOUS) ×2 IMPLANT
GOWN ISOL THUMB LOOP REG UNIV (MISCELLANEOUS) ×4
KIT ENDO PROCEDURE OLY (KITS) ×3 IMPLANT
WATER STERILE IRR 250ML POUR (IV SOLUTION) ×3 IMPLANT

## 2019-03-14 NOTE — Op Note (Signed)
[facility] Patient Name: Taylor Gilmore Procedure Date: 03/14/2019 8:48 AM MRN: [id number] Account #: [id number] Date of Birth: 10/04/1960 Admit Type: Outpatient Age: 58 Room: [facility] OR ROOM 01 Gender: Female Note Status: Finalized Procedure:             Colonoscopy Indications:           Screening in patient at increased risk: Family history                         of 1st-degree relative with colorectal cancer before                         age 77 years Providers:             [name] MD, MD Referring MD:          Bethena Roys. Sowles, MD (Referring MD) Medicines:             Propofol per Anesthesia Complications:         No immediate complications. Procedure:             Pre-Anesthesia Assessment:                        - Prior to the procedure, a History and Physical was                         performed, and patient medications and allergies were                         reviewed. The patient's tolerance of previous                         anesthesia was also reviewed. The risks and benefits                         of the procedure and the sedation options and risks                         were discussed with the patient. All questions were                         answered, and informed consent was obtained. Prior                         Anticoagulants: The patient has taken no previous                         anticoagulant or antiplatelet agents. ASA Grade                         Assessment: II - A patient with mild systemic disease.                         After reviewing the risks and benefits, the patient                         was deemed in satisfactory condition to undergo the  procedure.                        After obtaining informed consent, the colonoscope was                         passed under direct vision. Throughout the procedure,                         the patient's blood pressure, pulse, and oxygen                     saturations were monitored continuously. The was                         introduced through the anus and advanced to the the                         cecum, identified by appendiceal orifice and ileocecal                         valve. The colonoscopy was performed without                         difficulty. The patient tolerated the procedure well.                         The quality of the bowel preparation was excellent. Findings:      The perianal and digital rectal examinations were normal.      A 3 mm polyp was found in the ascending colon. The polyp was sessile.       The polyp was removed with a cold biopsy forceps. Resection and       retrieval were complete.      Non-bleeding internal hemorrhoids were found during retroflexion. The       hemorrhoids were Grade I (internal hemorrhoids that do not prolapse). Impression:            - One 3 mm polyp in the ascending colon, removed with                         a cold biopsy forceps. Resected and retrieved.                        - Non-bleeding internal hemorrhoids. Recommendation:        - Discharge patient to home.                        - Resume previous diet.                        - Continue present medications.                        - Await pathology results.                        - Repeat colonoscopy in 5 years for surveillance. Procedure Code(s):     --- Professional ---  45380, Colonoscopy, flexible; with biopsy, single or                         multiple Diagnosis Code(s):     --- Professional ---                        Z80.0, Family history of malignant neoplasm of                         digestive organs                        K63.5, Polyp of colon CPT copyright 2019 American Medical Association. All rights reserved. The codes documented in this report are preliminary and upon coder review may  be revised to meet current compliance requirements. [name] MD, MD 03/14/2019  9:08:08 AM This report has been signed electronically. Number of Addenda: 0 Note Initiated On: 03/14/2019 8:48 AM Scope Withdrawal Time: 0 hours 9 minutes 6 seconds  Total Procedure Duration: 0 hours 12 minutes 0 seconds  Estimated Blood Loss:  Estimated blood loss: none.      [facility]

## 2019-03-14 NOTE — Transfer of Care (Signed)
Immediate Anesthesia Transfer of Care Note  Patient: Taylor Gilmore  Procedure(s) Performed: COLONOSCOPY WITH BIOPSY (N/A Rectum) POLYPECTOMY (N/A Rectum)  Patient Location: PACU  Anesthesia Type: General  Level of Consciousness: awake, alert  and patient cooperative  Airway and Oxygen Therapy: Patient Spontanous Breathing and Patient connected to supplemental oxygen  Post-op Assessment: Post-op Vital signs reviewed, Patient's Cardiovascular Status Stable, Respiratory Function Stable, Patent Airway and No signs of Nausea or vomiting  Post-op Vital Signs: Reviewed and stable  Complications: No apparent anesthesia complications

## 2019-03-14 NOTE — Anesthesia Procedure Notes (Signed)
Date/Time: 03/14/2019 9:00 AM Performed by: Jeannene Patella, CRNA Pre-anesthesia Checklist: Patient identified, Emergency Drugs available, Suction available, Patient being monitored and Timeout performed Patient Re-evaluated:Patient Re-evaluated prior to induction Oxygen Delivery Method: Nasal cannula

## 2019-03-14 NOTE — Anesthesia Postprocedure Evaluation (Signed)
Anesthesia Post Note  Patient: Taylor Gilmore  Procedure(s) Performed: COLONOSCOPY WITH BIOPSY (N/A Rectum) POLYPECTOMY (N/A Rectum)     Patient location during evaluation: PACU Anesthesia Type: General Level of consciousness: awake and alert Pain management: pain level controlled Vital Signs Assessment: post-procedure vital signs reviewed and stable Respiratory status: spontaneous breathing, nonlabored ventilation, respiratory function stable and patient connected to nasal cannula oxygen Cardiovascular status: blood pressure returned to baseline and stable Postop Assessment: no apparent nausea or vomiting Anesthetic complications: no    Esabella Stockinger A  Yareli Carthen

## 2019-03-14 NOTE — H&P (Signed)
Taylor Lame, MD [facility] [address], [place], [place] [address] Phone:[phone] Fax : [phone]  Primary Care Physician:  Steele Sizer, MD Primary Gastroenterologist:  Dr. Allen Norris  Pre-Procedure History & Physical: HPI:  Taylor Gilmore is a 58 y.o. female is here for an colonoscopy.   Past Medical History:  Diagnosis Date  . Allergy   . Anxiety   . Bilateral hearing loss   . History of fracture of humerus   . Hyperlipidemia   . Hypothyroidism   . Knee pain   . Menopause   . Otosclerosis   . Over weight   . Thyroid disease   . Vitamin B12 deficiency (non anemic)   . Vitamin D deficiency     Past Surgical History:  Procedure Laterality Date  . EXTERNAL EAR SURGERY Right    6th grade  . KNEE SURGERY Right     Prior to Admission medications   Medication Sig Start Date End Date Taking? Authorizing Provider  Cholecalciferol (VITAMIN D HIGH POTENCY) 1000 UNITS capsule Take 1 tablet by mouth daily. 06/07/10  Yes [provider]  LORazepam (ATIVAN) 0.5 MG tablet Take 1 tablet (0.5 mg total) by mouth as needed. 02/27/19  Yes Sowles, Drue Stager, MD  Methylcobalamin (B12-ACTIVE) 1 MG CHEW Chew 1 tablet by mouth daily.   Yes [provider]  pravastatin (PRAVACHOL) 40 MG tablet TAKE 1 TABLET BY MOUTH AT  BEDTIME 02/05/19  Yes Sowles, Drue Stager, MD  SYNTHROID 100 MCG tablet Take one daily, new direction 02/27/19  Yes Steele Sizer, MD    Allergies as of 01/27/2019  . (No Known Allergies)    Family History  Problem Relation Age of Onset  . Hypertension Mother   . Alcohol abuse Father   . Cancer Father        Colon  . Breast cancer Neg Hx     Social History   Socioeconomic History  . Marital status: Married    Spouse name: Laveda Abbe   . Number of children: 3  . Years of education: Not on file  . Highest education level: High school graduate  Occupational History  . 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 Financial  resource strain: Not hard at all  . Food insecurity    Worry: Never true    Inability: Never true  . Transportation needs    Medical: No    Non-medical: No  Tobacco Use  . Smoking status: Former Smoker    Packs/day: 0.25    Years: 10.00    Pack years: 2.50    Types: Cigarettes    Start date: 04/25/1983    Quit date: 04/24/1993    Years since quitting: 25.9  . Smokeless tobacco: Never Used  Substance and Sexual Activity  . Alcohol use: No    Alcohol/week: 0.0 standard drinks  . Drug use: No  . Sexual activity: Not Currently  Lifestyle  . Physical activity    Days per week: 3 days    Minutes per session: 40 min  . Stress: Not at all  Relationships  . Social Herbalist on phone: Twice a week    Gets together: Twice a week    Attends religious service: Never    Active member of club or organization: No    Attends meetings of clubs or organizations: Never    Relationship status: Married  . Intimate partner violence    Fear of current or ex  partner: No    Emotionally abused: No    Physically abused: No    Forced sexual activity: No  Other Topics Concern  . Not on file  Social History Narrative   Daughter, son-in-law and grandson moved in with them 04/2018 to build a house but with COVID-19 their plans has been delayed     Review of Systems: See HPI, otherwise negative ROS  Physical Exam: BP 132/79   Pulse 76   Temp 98.1 F (36.7 C)   Ht 5' 2.25" (1.581 m)   Wt 69.9 kg   SpO2 100%   BMI 27.94 kg/m  General:   Alert,  pleasant and cooperative in NAD Head:  Normocephalic and atraumatic. Neck:  Supple; no masses or thyromegaly. Lungs:  Clear throughout to auscultation.    Heart:  Regular rate and rhythm. Abdomen:  Soft, nontender and nondistended. Normal bowel sounds, without guarding, and without rebound.   Neurologic:  Alert and  oriented x4;  grossly normal neurologically.  Impression/Plan: Anginette Michals is here for an colonoscopy to be  performed for father with colon cancer before the age of 26  Risks, benefits, limitations, and alternatives regarding  colonoscopy have been reviewed with the patient.  Questions have been answered.  All parties agreeable.   Taylor Lame, MD  03/14/2019, 8:42 AM

## 2019-03-17 ENCOUNTER — Encounter: Payer: Self-pay | Admitting: Gastroenterology

## 2019-03-18 ENCOUNTER — Encounter: Payer: Self-pay | Admitting: Gastroenterology

## 2019-03-18 LAB — SURGICAL PATHOLOGY

## 2019-03-28 ENCOUNTER — Ambulatory Visit
Admission: RE | Admit: 2019-03-28 | Discharge: 2019-03-28 | Disposition: A | Discharge status: Home / Self Care | Payer: Managed Care, Other (non HMO) | Source: Ambulatory Visit | Attending: Family Medicine | Admitting: Family Medicine

## 2019-03-28 DIAGNOSIS — Z1231 Encounter for screening mammogram for malignant neoplasm of breast: Secondary | ICD-10-CM | POA: Insufficient documentation

## 2019-04-08 ENCOUNTER — Other Ambulatory Visit: Payer: Self-pay | Admitting: Family Medicine

## 2019-04-08 DIAGNOSIS — E039 Hypothyroidism, unspecified: Secondary | ICD-10-CM

## 2019-04-08 LAB — TSH: TSH: 0.065 u[IU]/mL — ABNORMAL LOW (ref 0.450–4.500)

## 2019-04-10 ENCOUNTER — Ambulatory Visit: Payer: Managed Care, Other (non HMO) | Attending: Internal Medicine

## 2019-04-10 ENCOUNTER — Other Ambulatory Visit: Payer: Self-pay

## 2019-04-10 DIAGNOSIS — Z20822 Contact with and (suspected) exposure to covid-19: Secondary | ICD-10-CM

## 2019-04-11 LAB — NOVEL CORONAVIRUS, NAA: SARS-CoV-2, NAA: NOT DETECTED

## 2019-04-30 ENCOUNTER — Ambulatory Visit: Payer: Managed Care, Other (non HMO) | Attending: Internal Medicine

## 2019-04-30 DIAGNOSIS — Z20822 Contact with and (suspected) exposure to covid-19: Secondary | ICD-10-CM

## 2019-05-01 LAB — NOVEL CORONAVIRUS, NAA: SARS-CoV-2, NAA: DETECTED — AB

## 2019-05-20 ENCOUNTER — Other Ambulatory Visit: Payer: Self-pay | Admitting: Family Medicine

## 2019-05-20 ENCOUNTER — Other Ambulatory Visit: Payer: Self-pay

## 2019-05-20 DIAGNOSIS — E039 Hypothyroidism, unspecified: Secondary | ICD-10-CM

## 2019-05-20 LAB — TSH: TSH: 0.112 u[IU]/mL — ABNORMAL LOW (ref 0.450–4.500)

## 2019-06-30 ENCOUNTER — Telehealth: Payer: Self-pay

## 2019-06-30 DIAGNOSIS — E039 Hypothyroidism, unspecified: Secondary | ICD-10-CM

## 2019-06-30 NOTE — Telephone Encounter (Signed)
Copied from [place] [id number]. Topic: General - Other >> Jun 30, 2019  7:52 AM Rayann Heman wrote: Reason for CRM: pt called and stated that she would like an email with the TSH order. She has an appt at [facility] this morning. Please email to [email or web address]

## 2019-07-03 LAB — TSH: TSH: 0.302 u[IU]/mL — ABNORMAL LOW (ref 0.450–4.500)

## 2019-07-04 NOTE — Telephone Encounter (Signed)
Copied from [place] [phone]. Topic: General - Other >> Jul 03, 2019  3:25 PM Wynetta Emery, [place] C wrote: Reason for CRM: pt called in to be advised. Pt says that her directions to taking SYNTHROID 100 MCG tablet has not changed. Pt is unclear of "change of direction" pt says that she received mychart message from PCP

## 2019-07-04 NOTE — Telephone Encounter (Signed)
The directions given for her suppressed TSH for March 10 th lab results are the same from January's directions Synthroid 100 mcq daily and no pill on Sunday. Please advise how patient needs to change her dosage due to her most recent lab results.

## 2019-07-05 ENCOUNTER — Other Ambulatory Visit: Payer: Self-pay | Admitting: Family Medicine

## 2019-07-05 DIAGNOSIS — E039 Hypothyroidism, unspecified: Secondary | ICD-10-CM

## 2019-07-05 MED ORDER — LEVOTHYROXINE SODIUM 88 MCG PO TABS: 88.0000 ug | ORAL_TABLET | Freq: Every day | ORAL | 0 refills | Status: DC

## 2019-07-07 NOTE — Telephone Encounter (Signed)
Patient called. No answer left vm. Please take 100 mcg daily and no pill on Sunday.

## 2019-07-21 ENCOUNTER — Ambulatory Visit: Payer: Managed Care, Other (non HMO) | Attending: Internal Medicine

## 2019-07-21 DIAGNOSIS — Z23 Encounter for immunization: Secondary | ICD-10-CM

## 2019-07-21 NOTE — Progress Notes (Signed)
   Covid-19 Vaccination Clinic  Name:  Miren Cancienne    MRN: [id number] DOB: 08/17/1960  07/21/2019  Ms. Spengler was observed post Covid-19 immunization for 15 minutes without incident. She was provided with Vaccine Information Sheet and instruction to access the V-Safe system.   Ms. Vanderheyden was instructed to call 911 with any severe reactions post vaccine: Marland Kitchen Difficulty breathing  . Swelling of face and throat  . A fast heartbeat  . A bad rash all over body  . Dizziness and weakness   Immunizations Administered    Name Date Dose VIS Date Route   Pfizer COVID-19 Vaccine 07/21/2019  3:48 PM 0.3 mL 04/04/2019 Intramuscular   Manufacturer: [place]   Lot: [id number]   [place]: [id number]

## 2019-08-11 ENCOUNTER — Ambulatory Visit: Payer: Managed Care, Other (non HMO) | Attending: Internal Medicine

## 2019-08-11 DIAGNOSIS — Z23 Encounter for immunization: Secondary | ICD-10-CM

## 2019-08-11 NOTE — Progress Notes (Signed)
   Covid-19 Vaccination Clinic  Name:  Michielle Bochenek    MRN: [id number] DOB: [birth year]  08/11/2019  Ms. Weisfeld was observed post Covid-19 immunization for 15 minutes without incident. She was provided with Vaccine Information Sheet and instruction to access the V-Safe system.   Ms. Semans was instructed to call 911 with any severe reactions post vaccine: Marland Kitchen Difficulty breathing  . Swelling of face and throat  . A fast heartbeat  . A bad rash all over body  . Dizziness and weakness   Immunizations Administered    Name Date Dose VIS Date Route   Pfizer COVID-19 Vaccine 08/11/2019  3:16 PM 0.3 mL 06/18/2018 Intramuscular   Manufacturer: [place]   Lot: [id number]   [place]: [id number]

## 2019-08-22 ENCOUNTER — Other Ambulatory Visit: Payer: Self-pay | Admitting: Podiatry

## 2019-08-22 ENCOUNTER — Other Ambulatory Visit: Payer: Self-pay

## 2019-08-22 ENCOUNTER — Encounter: Payer: Self-pay | Admitting: Podiatry

## 2019-08-22 ENCOUNTER — Other Ambulatory Visit
Admission: RE | Admit: 2019-08-22 | Discharge: 2019-08-22 | Disposition: A | Discharge status: Home / Self Care | Payer: Managed Care, Other (non HMO) | Source: Ambulatory Visit | Attending: Podiatry | Admitting: Podiatry

## 2019-08-22 DIAGNOSIS — Z01812 Encounter for preprocedural laboratory examination: Secondary | ICD-10-CM | POA: Insufficient documentation

## 2019-08-22 DIAGNOSIS — Z20822 Contact with and (suspected) exposure to covid-19: Secondary | ICD-10-CM | POA: Diagnosis not present

## 2019-08-22 LAB — SARS CORONAVIRUS 2 (TAT 6-24 HRS): SARS Coronavirus 2: NEGATIVE

## 2019-08-25 ENCOUNTER — Other Ambulatory Visit: Payer: Managed Care, Other (non HMO)

## 2019-08-26 ENCOUNTER — Ambulatory Visit: Payer: Managed Care, Other (non HMO) | Admitting: Anesthesiology

## 2019-08-26 ENCOUNTER — Ambulatory Visit
Admission: RE | Admit: 2019-08-26 | Discharge: 2019-08-26 | Disposition: A | Discharge status: Home / Self Care | Payer: Managed Care, Other (non HMO) | Attending: Podiatry | Admitting: Podiatry

## 2019-08-26 ENCOUNTER — Encounter: Payer: Self-pay | Admitting: Podiatry

## 2019-08-26 ENCOUNTER — Encounter
Admission: RE | Disposition: A | Discharge status: Home / Self Care | Payer: Self-pay | Source: Home / Self Care | Attending: Podiatry

## 2019-08-26 ENCOUNTER — Other Ambulatory Visit: Payer: Self-pay

## 2019-08-26 DIAGNOSIS — Z79890 Hormone replacement therapy (postmenopausal): Secondary | ICD-10-CM | POA: Insufficient documentation

## 2019-08-26 DIAGNOSIS — Z7982 Long term (current) use of aspirin: Secondary | ICD-10-CM | POA: Diagnosis not present

## 2019-08-26 DIAGNOSIS — S92352A Displaced fracture of fifth metatarsal bone, left foot, initial encounter for closed fracture: Secondary | ICD-10-CM | POA: Diagnosis present

## 2019-08-26 DIAGNOSIS — Z8616 Personal history of COVID-19: Secondary | ICD-10-CM | POA: Insufficient documentation

## 2019-08-26 DIAGNOSIS — X58XXXD Exposure to other specified factors, subsequent encounter: Secondary | ICD-10-CM | POA: Insufficient documentation

## 2019-08-26 DIAGNOSIS — E559 Vitamin D deficiency, unspecified: Secondary | ICD-10-CM | POA: Diagnosis not present

## 2019-08-26 DIAGNOSIS — Z9119 Patient's noncompliance with other medical treatment and regimen: Secondary | ICD-10-CM | POA: Diagnosis not present

## 2019-08-26 DIAGNOSIS — Z87891 Personal history of nicotine dependence: Secondary | ICD-10-CM | POA: Diagnosis not present

## 2019-08-26 DIAGNOSIS — S92352D Displaced fracture of fifth metatarsal bone, left foot, subsequent encounter for fracture with routine healing: Secondary | ICD-10-CM | POA: Insufficient documentation

## 2019-08-26 DIAGNOSIS — E039 Hypothyroidism, unspecified: Secondary | ICD-10-CM | POA: Insufficient documentation

## 2019-08-26 HISTORY — PX: ORIF TOE FRACTURE: SHX5032

## 2019-08-26 SURGERY — OPEN REDUCTION INTERNAL FIXATION (ORIF) METATARSAL (TOE) FRACTURE
Anesthesia: Regional | Site: Toe | Laterality: Left

## 2019-08-26 MED ORDER — DEXMEDETOMIDINE HCL 200 MCG/2ML IV SOLN
INTRAVENOUS | Status: DC | PRN
  Administered 2019-08-26: 10 ug via INTRAVENOUS

## 2019-08-26 MED ORDER — ONDANSETRON HCL 4 MG PO TABS: 4.0000 mg | ORAL_TABLET | Freq: Three times a day (TID) | ORAL | 0 refills | Status: AC | PRN

## 2019-08-26 MED ORDER — APIXABAN 2.5 MG PO TABS
2.5000 mg | ORAL_TABLET | Freq: Two times a day (BID) | ORAL | 1 refills | Status: DC
Start: 2019-08-26 — End: 2019-08-26

## 2019-08-26 MED ORDER — OXYCODONE HCL 5 MG PO TABS: 5.0000 mg | ORAL_TABLET | Freq: Once | ORAL | Status: DC | PRN

## 2019-08-26 MED ORDER — CEPHALEXIN 500 MG PO CAPS: 500.0000 mg | ORAL_CAPSULE | Freq: Three times a day (TID) | ORAL | 0 refills | Status: AC

## 2019-08-26 MED ORDER — FENTANYL CITRATE (PF) 100 MCG/2ML IJ SOLN
INTRAMUSCULAR | Status: DC | PRN
  Administered 2019-08-26: 100 ug via INTRAVENOUS

## 2019-08-26 MED ORDER — LACTATED RINGERS IV SOLN: INTRAVENOUS | Status: DC | PRN

## 2019-08-26 MED ORDER — POVIDONE-IODINE 7.5 % EX SOLN: Freq: Once | CUTANEOUS | Status: AC

## 2019-08-26 MED ORDER — LIDOCAINE HCL (CARDIAC) PF 100 MG/5ML IV SOSY
PREFILLED_SYRINGE | INTRAVENOUS | Status: DC | PRN
  Administered 2019-08-26: 40 mg via INTRAVENOUS

## 2019-08-26 MED ORDER — ROPIVACAINE HCL 5 MG/ML IJ SOLN
INTRAMUSCULAR | Status: DC | PRN
  Administered 2019-08-26 (×2): 10 mL via PERINEURAL
  Administered 2019-08-26: 5 mL via PERINEURAL

## 2019-08-26 MED ORDER — OXYCODONE HCL 5 MG/5ML PO SOLN: 5.0000 mg | Freq: Once | ORAL | Status: DC | PRN

## 2019-08-26 MED ORDER — GLYCOPYRROLATE 0.2 MG/ML IJ SOLN
INTRAMUSCULAR | Status: DC | PRN
  Administered 2019-08-26: .1 mg via INTRAVENOUS

## 2019-08-26 MED ORDER — APIXABAN 2.5 MG PO TABS
2.5000 mg | ORAL_TABLET | Freq: Two times a day (BID) | ORAL | 1 refills | Status: DC
Start: 2019-08-27 — End: 2019-10-20

## 2019-08-26 MED ORDER — HYDROCODONE-ACETAMINOPHEN 7.5-325 MG PO TABS: 1.0000 | ORAL_TABLET | Freq: Four times a day (QID) | ORAL | 0 refills | Status: AC | PRN

## 2019-08-26 MED ORDER — MIDAZOLAM HCL 2 MG/2ML IJ SOLN
INTRAMUSCULAR | Status: DC | PRN
  Administered 2019-08-26: 2 mg via INTRAVENOUS

## 2019-08-26 MED ORDER — ONDANSETRON HCL 4 MG/2ML IJ SOLN
INTRAMUSCULAR | Status: DC | PRN
  Administered 2019-08-26: 4 mg via INTRAVENOUS

## 2019-08-26 MED ORDER — CEFAZOLIN SODIUM-DEXTROSE 2-4 GM/100ML-% IV SOLN
2.0000 g | INTRAVENOUS | Status: AC
  Administered 2019-08-26: 2 g via INTRAVENOUS

## 2019-08-26 MED ORDER — PROPOFOL 10 MG/ML IV BOLUS
INTRAVENOUS | Status: DC | PRN
  Administered 2019-08-26: 50 mg via INTRAVENOUS
  Administered 2019-08-26: 100 ug/kg/min via INTRAVENOUS

## 2019-08-26 SURGICAL SUPPLY — 46 items
ADHESIVE MASTISOL STRL (MISCELLANEOUS) ×3 IMPLANT
BIT DRILL 1.3 (BIT) ×1
BIT DRILL 1.3MM (BIT) ×1
BIT DRILL 1.7 LNG CANN (DRILL) ×3 IMPLANT
BIT DRILL 100X1.3XAO CNCT (BIT) ×1 IMPLANT
BIT DRL 100X1.3XAO CNCT (BIT) ×1
BNDG ELASTIC 4X5.8 VLCR STR LF (GAUZE/BANDAGES/DRESSINGS) ×3 IMPLANT
BNDG ELASTIC 6X5.8 VLCR STR LF (GAUZE/BANDAGES/DRESSINGS) ×3 IMPLANT
BNDG ESMARK 4X12 TAN STRL LF (GAUZE/BANDAGES/DRESSINGS) ×3 IMPLANT
BNDG GAUZE 4.5X4.1 6PLY STRL (MISCELLANEOUS) ×3 IMPLANT
CANISTER SUCT 1200ML W/VALVE (MISCELLANEOUS) ×3 IMPLANT
CLOSURE WOUND 1/4X4 (GAUZE/BANDAGES/DRESSINGS) ×1
COVER LIGHT HANDLE FLEXIBLE (MISCELLANEOUS) ×6 IMPLANT
CUFF TOURN SGL QUICK 18X4 (TOURNIQUET CUFF) ×3 IMPLANT
DRAPE FLUOR MINI C-ARM 54X84 (DRAPES) ×3 IMPLANT
DURAPREP 26ML APPLICATOR (WOUND CARE) ×3 IMPLANT
ELECT REM PT RETURN 9FT ADLT (ELECTROSURGICAL) ×3
ELECTRODE REM PT RTRN 9FT ADLT (ELECTROSURGICAL) ×1 IMPLANT
GAUZE SPONGE 4X4 12PLY STRL (GAUZE/BANDAGES/DRESSINGS) ×3 IMPLANT
GAUZE XEROFORM 1X8 LF (GAUZE/BANDAGES/DRESSINGS) ×3 IMPLANT
GLOVE BIO SURGEON STRL SZ7 (GLOVE) ×6 IMPLANT
GLOVE BIOGEL PI IND STRL 7.0 (GLOVE) ×2 IMPLANT
GLOVE BIOGEL PI INDICATOR 7.0 (GLOVE) ×4
GOWN STRL REUS W/ TWL LRG LVL3 (GOWN DISPOSABLE) ×2 IMPLANT
GOWN STRL REUS W/TWL LRG LVL3 (GOWN DISPOSABLE) ×4
KIT TURNOVER KIT A (KITS) ×3 IMPLANT
NS IRRIG 500ML POUR BTL (IV SOLUTION) ×3 IMPLANT
PACK EXTREMITY ARMC (MISCELLANEOUS) ×3 IMPLANT
PADDING CAST BLEND 4X4 NS (MISCELLANEOUS) ×6 IMPLANT
PENCIL SMOKE EVACUATOR (MISCELLANEOUS) ×3 IMPLANT
PLATE T 8 HOLE (Plate) ×1 IMPLANT
SCREW 2.0 X10 LOCKING (Screw) ×6 IMPLANT
SCREW 2.0 X9 LOCKING (Screw) ×3 IMPLANT
SCREW 2.5X12 FULL THREAD (Screw) ×3 IMPLANT
SCREW 2.5X15 ST (Screw) ×3 IMPLANT
SCREW LOCK PLATE 2.0X8 (Screw) ×6 IMPLANT
SPLINT CAST 1 STEP 4X30 (MISCELLANEOUS) ×3 IMPLANT
STRIP CLOSURE SKIN 1/4X4 (GAUZE/BANDAGES/DRESSINGS) ×2 IMPLANT
SUT MNCRL 4-0 (SUTURE) ×2
SUT MNCRL 4-0 27XMFL (SUTURE) ×1
SUT VIC AB 3-0 SH 27 (SUTURE) ×2
SUT VIC AB 3-0 SH 27X BRD (SUTURE) ×1 IMPLANT
SUTURE MNCRL 4-0 27XMF (SUTURE) ×1 IMPLANT
T PLATE 8 HOLE (Plate) ×3 IMPLANT
WIRE OLIVE SMOOTH 1.3 (WIRE) ×6 IMPLANT
WIRE SMOOTH TROCAR .9MMX150MML (WIRE) ×9 IMPLANT

## 2019-08-26 NOTE — Discharge Instructions (Signed)
[place] DR. TROXLER, DR. Vickki Muff, AND DR. [place]   1. Take your medication as prescribed.  Pain medication should be taken only as needed.  You may taken ibuprofen or tylenol between pain medication doses to improve pain further.  If still having pain that is severe and discomfort you may take 2 pain pills every 6 hours.  Try to limit this as pain is normal after this type of procedure and pain medications all used to make the pain more tolerable.  Take all other medication as prescribed.  2. Take Eliquis medication 24 hours after the procedure.  3. Keep the dressing clean, dry and intact.  4. Keep your foot elevated above the heart level for the first 48 hours.  5. Walking to the bathroom and brief periods of walking are acceptable, unless we have instructed you to be non-weight bearing.  6. Always wear your post-op shoe when walking.  Always use your crutches if you are to be non-weight bearing.  7. Do not take a shower. Baths are permissible as long as the foot is kept out of the water.   8. Every hour you are awake:  - Bend your knee 15 times. - Massage calf 15 times  9. Call [facility] [phone]) if any of the following problems occur: - You develop a temperature or fever. - The bandage becomes saturated with blood. - Medication does not stop your pain. - Injury of the foot occurs. - Any symptoms of infection including redness, odor, or red streaks running from wound.   General Anesthesia, Adult, Care After This sheet gives you information about how to care for yourself after your procedure. Your health care provider may also give you more specific instructions. If you have problems or questions, contact your health care provider. What can I expect after the procedure? After the procedure, the following side effects are common:  Pain or discomfort at  the IV site.  Nausea.  Vomiting.  Sore throat.  Trouble concentrating.  Feeling cold or chills.  Weak or tired.  Sleepiness and fatigue.  Soreness and body aches. These side effects can affect parts of the body that were not involved in surgery. Follow these instructions at home:  For at least 24 hours after the procedure:  Have a responsible adult stay with you. It is important to have someone help care for you until you are awake and alert.  Rest as needed.  Do not: ? Participate in activities in which you could fall or become injured. ? Drive. ? Use heavy machinery. ? Drink alcohol. ? Take sleeping pills or medicines that cause drowsiness. ? Make important decisions or sign legal documents. ? Take care of children on your own. Eating and drinking  Follow any instructions from your health care provider about eating or drinking restrictions.  When you feel hungry, start by eating small amounts of foods that are soft and easy to digest (bland), such as toast. Gradually return to your regular diet.  Drink enough fluid to keep your urine pale yellow.  If you vomit, rehydrate by drinking water, juice, or clear broth. General instructions  If you have sleep apnea, surgery and certain medicines can increase your risk for breathing problems. Follow instructions from your health care provider about wearing your sleep device: ? Anytime you are sleeping, including during daytime naps. ? While taking prescription pain medicines, sleeping medicines, or medicines that make  you drowsy.  Return to your normal activities as told by your health care provider. Ask your health care provider what activities are safe for you.  Take over-the-counter and prescription medicines only as told by your health care provider.  If you smoke, do not smoke without supervision.  Keep all follow-up visits as told by your health care provider. This is important. Contact a health care provider  if:  You have nausea or vomiting that does not get better with medicine.  You cannot eat or drink without vomiting.  You have pain that does not get better with medicine.  You are unable to pass urine.  You develop a skin rash.  You have a fever.  You have redness around your IV site that gets worse. Get help right away if:  You have difficulty breathing.  You have chest pain.  You have blood in your urine or stool, or you vomit blood. Summary  After the procedure, it is common to have a sore throat or nausea. It is also common to feel tired.  Have a responsible adult stay with you for the first 24 hours after general anesthesia. It is important to have someone help care for you until you are awake and alert.  When you feel hungry, start by eating small amounts of foods that are soft and easy to digest (bland), such as toast. Gradually return to your regular diet.  Drink enough fluid to keep your urine pale yellow.  Return to your normal activities as told by your health care provider. Ask your health care provider what activities are safe for you. This information is not intended to replace advice given to you by your health care provider. Make sure you discuss any questions you have with your health care provider. Document Revised: 04/13/2017 Document Reviewed: 11/24/2016 Elsevier Patient Education  [place].

## 2019-08-26 NOTE — Anesthesia Preprocedure Evaluation (Addendum)
Anesthesia Evaluation  Patient identified by MRN, date of birth, ID band Patient awake    Reviewed: NPO status   History of Anesthesia Complications Negative for: history of anesthetic complications  Airway Mallampati: II  TM Distance: >3 FB Neck ROM: full    Dental no notable dental hx.    Pulmonary neg pulmonary ROS, former smoker,    Pulmonary exam normal        Cardiovascular Exercise Tolerance: Good negative cardio ROS Normal cardiovascular exam     Neuro/Psych Anxiety Bilateral hearing loss negative neurological ROS     GI/Hepatic negative GI ROS, Neg liver ROS,   Endo/Other  Hypothyroidism Morbid obesity (bmi 29)  Renal/GU negative Renal ROS  negative genitourinary   Musculoskeletal   Abdominal   Peds  Hematology negative hematology ROS (+)   Anesthesia Other Findings  Covid: NEG.  Covid+ : Jan 2021 : No Sx.  Reproductive/Obstetrics                             Anesthesia Physical Anesthesia Plan  ASA: II  Anesthesia Plan: General and Regional   Post-op Pain Management: GA combined w/ Regional for post-op pain   Induction:   PONV Risk Score and Plan: 3 and Midazolam, Ondansetron and Treatment may vary due to age or medical condition  Airway Management Planned:   Additional Equipment:   Intra-op Plan:   Post-operative Plan:   Informed Consent: I have reviewed the patients History and Physical, chart, labs and discussed the procedure including the risks, benefits and alternatives for the proposed anesthesia with the patient or authorized representative who has indicated his/her understanding and acceptance.       Plan Discussed with: CRNA  Anesthesia Plan Comments: (Popl. Block requested by surgeon.)       Anesthesia Quick Evaluation

## 2019-08-26 NOTE — Anesthesia Postprocedure Evaluation (Signed)
Anesthesia Post Note  Patient: Nariah Amedee  Procedure(s) Performed: OPEN REDUCTION INTERNAL FIXATION (ORIF) METATARSAL (TOE) FRACTURE LEFT 5TH (Left Toe)     Patient location during evaluation: PACU Anesthesia Type: Regional Level of consciousness: awake and alert Pain management: pain level controlled Vital Signs Assessment: post-procedure vital signs reviewed and stable Respiratory status: spontaneous breathing, nonlabored ventilation, respiratory function stable and patient connected to nasal cannula oxygen Cardiovascular status: blood pressure returned to baseline and stable Postop Assessment: no apparent nausea or vomiting Anesthetic complications: no    Zaydan Papesh

## 2019-08-26 NOTE — Progress Notes (Signed)
Assisted Taylor Gilmore ANMD with left, popliteal block. Side rails up, monitors on throughout procedure. See vital signs in flow sheet. Tolerated Procedure well.

## 2019-08-26 NOTE — Op Note (Signed)
PODIATRY / FOOT AND ANKLE SURGERY OPERATIVE REPORT    SURGEON: Caroline More, DPM  PRE-OPERATIVE DIAGNOSIS:  1. Left 5th metatarsal closed displaced fracture with mild comminution  POST-OPERATIVE DIAGNOSIS: Same  PROCEDURE(S): 1. Left foot 5th metatarsal fracture open reduction with internal fixation  HEMOSTASIS: Left ankle tourniquet  ANESTHESIA: MAC  ESTIMATED BLOOD LOSS: 10 cc  FINDING(S): 1.  Left foot 5th metatarsal shaft comminuted fracture with dorsal displacement and slight shortening.  PATHOLOGY/SPECIMEN(S): None taken  INDICATIONS:   Taylor Gilmore is a 59 y.o. female who presents with a fracture to the left 5th metatarsal that is displaced and slightly comminuted.  Patient was seen by Dr Cleda Mccreedy in clinic due to this issue as the fracture was initially nondisplaced.  Patient was noncompliant and walked outside of the boot into normal sandal despite instructions to maintain partial weightbearing in a boot and try to stay off of it as much possible.  Rex-ray imaging was then taken and it showed that the fracture had displaced.  The patient was then sent for surgical consultation due to the displaced fracture.  Patient presents today for surgical correction.  All treatment options were discussed with the patient both conservative and surgical attempts at correction include potential risks and complications of surgical intervention at this time patient is elected for surgery.  All questions answered..  DESCRIPTION: After obtaining full informed written consent, the patient was brought back to the operating room and placed supine upon the operating table.  The patient received IV antibiotics prior to induction.  A preoperative popliteal and saphenous nerve block was performed by anesthesia.  A tourniquet was placed about the left ankle.  After obtaining adequate anesthesia, the patient was prepped and draped in the standard fashion.  An Esmarch bandage used to exsanguinate the  left lower extremity pneumatic ankle tourniquet was inflated.  Attention was then directed to the left foot at the 5th metatarsal midshaft level where an incision was made from the proximal metatarsal to the 5th metatarsal phalangeal joint area.  The incision was deepened to the subcutaneous tissue using sharp and blunt dissection all venous contributories were cauterized as necessary and neurovascular structures were retracted as needed.  A periosteal and capsular type incision was made along the 5th metatarsal shaft and the periosteal and capsular tissue was reflected medially and laterally thereby exposing the 5th metatarsal fracture at the midshaft.  There appeared to be a small amount of fracture callus formation but the fracture appeared to be still viewable and the capital fragment appeared to be shifted dorsally and slightly proximally.  A Freer elevator was used to free up the fracture fragment.  There appeared to be some comminution to this fracture as there appeared to be a large spike proximally that appeared to be slightly broken off from the remainder of the capital fragment.  Care was taken not to dissect this area too much as to keep the reduction in place when reduced.  A curette was used to remove some fibrous debris within the fracture site.  At this time the fracture was reduced by pulling the 5th metatarsal capital fragment distally and translating the capital fragment plantarly back into a reduced position.  The fracture fragments appeared to align very well.  A reduction clamp was used to hold compression across the reduction and keep it in place.  At this time a guidewire for 2.5 cannulated Paragon 28 screw was placed across the lateral plantar cortex of the 5th metatarsal slightly proximal midshaft  and was directed distal dorsal across the fracture line and into the capital fragment in the dorsal medial direction.  C-arm imaging was utilized to verify correct position and reduction which  appeared to be excellent.  At this time utilizing standard AO principles techniques a 2.5 x 12 mm fully threaded cannulated Paragon 28 screw was placed across the fracture line with excellent compression noted.  The reduction clamp was removed and the fracture appeared to be very stable.  At this time Paragon 28 T plate was then placed over the 5th metatarsal with the 2 screws at the T portion being at the distal 5th metatarsal at the level of the head and the remaining screws pointing proximally.  1 hole of the T plate was cut off and filed as it appeared to be slightly long.  There were 8 total holes to the T plate.  All of wires were then used to hold the plate in place with one olive wire being placed distal to the fracture site and one being placed proximal.  C-arm imaging was utilized to verify position of plate which appeared to be excellent.  At this time utilizing standard AO principles and techniques 2.5 mm Paragon 28 locking screws were placed of varying sizes depending on the depth gauge measurements.  A total of 5 2.5 mm Paragon 28 locking screws were placed with excellent seating noted.  C-arm imaging was utilized to verify correct position which appeared to be excellent overall.  The surgical site was flushed with copious amounts normal sterile saline.  The capsular and periosteal tissues were reapproximated well coapted with 3-0 Vicryl.  The subcutaneous tissue was reapproximated well coapted with 4-0 Monocryl.  The skin was then reapproximated well coapted with a subcuticular 4-0 Monocryl stitch.  Mastisol and Steri-Strips were then applied.  Postoperative dressing was then applied consisting of Xeroform followed by 4 x 4 gauze, Kerlix, web roll, posterior splint, Ace wrap.  The pneumatic ankle tourniquet was deflated prior to placing the postoperative dressing and a prompt hyperemic response was noted to the left foot.  The patient tolerated the procedure and anesthesia well was transferred  to recovery room vital signs stable vascular status intact all toes left foot.  Following.  Postoperative monitoring the patient be discharged home the following written oral postop instructions: Keep surgical dressings clean, dry, and intact, ice and elevate left lower extremity when at rest, remain nonweightbearing at all times left lower extremity, take antibiotics, antinausea, Eliquis, and pain medication as prescribed, and follow-up in clinic within 1 week of surgery date.  COMPLICATIONS: none  CONDITION: Good, stable  Caroline More, DPM

## 2019-08-26 NOTE — Transfer of Care (Signed)
Immediate Anesthesia Transfer of Care Note  Patient: Taylor Gilmore  Procedure(s) Performed: OPEN REDUCTION INTERNAL FIXATION (ORIF) METATARSAL (TOE) FRACTURE LEFT 5TH (Left Toe)  Patient Location: PACU  Anesthesia Type: General, Regional  Level of Consciousness: awake, alert  and patient cooperative  Airway and Oxygen Therapy: Patient Spontanous Breathing and Patient connected to supplemental oxygen  Post-op Assessment: Post-op Vital signs reviewed, Patient's Cardiovascular Status Stable, Respiratory Function Stable, Patent Airway and No signs of Nausea or vomiting  Post-op Vital Signs: Reviewed and stable  Complications: No apparent anesthesia complications

## 2019-08-26 NOTE — Anesthesia Procedure Notes (Signed)
Anesthesia Regional Block: Popliteal block   Pre-Anesthetic Checklist: ,, timeout performed, Correct Patient, Correct Site, Correct Laterality, Correct Procedure, Correct Position, site marked, Risks and benefits discussed,  Surgical consent,  Pre-op evaluation,  At surgeon's request and post-op pain management  Laterality: Left  Prep: chloraprep       Needles:  Injection technique: Single-shot  Needle Type: Echogenic Needle     Needle Length: 9cm  Needle Gauge: 21     Additional Needles:   Procedures:,,,, ultrasound used (permanent image in chart),,,,  Narrative:  Start time: 08/26/2019 11:35 AM End time: 08/26/2019 11:40 AM Injection made incrementally with aspirations every 5 mL.  Events: blood aspirated,,,,,,,,,,  Performed by: Personally  Anesthesiologist: Fidel Levy, MD  Additional Notes: Functioning IV was confirmed and monitors applied. Ultrasound guidance: relevant anatomy identified, needle position confirmed, local anesthetic spread visualized around nerve(s)., vascular puncture avoided.  Image printed for medical record.  Negative aspiration and no paresthesias; incremental administration of local anesthetic. The patient tolerated the procedure well. Vitals signes recorded in RN notes.

## 2019-08-26 NOTE — H&P (Signed)
HISTORY AND PHYSICAL INTERVAL NOTE:  08/26/2019  12:20 PM  Taylor Gilmore  has presented today for surgery, with the diagnosis of S92.35  LEFT DISPLACED FRACTURE 5TH METATARSAL.  The various methods of treatment have been discussed with the patient.  No guarantees were given.  After consideration of risks, benefits and other options for treatment, the patient has consented to surgery.  I have reviewed the patients' chart and labs.    PROCEDURE: LEFT 5TH METATARSAL FRACTURE ORIF  A history and physical examination was performed in my office.  The patient was reexamined.  There have been no changes to this history and physical examination.  Caroline More, DPM

## 2019-08-27 ENCOUNTER — Encounter: Payer: Self-pay | Admitting: *Deleted

## 2019-09-03 ENCOUNTER — Other Ambulatory Visit: Payer: Self-pay | Admitting: Family Medicine

## 2019-09-03 DIAGNOSIS — E039 Hypothyroidism, unspecified: Secondary | ICD-10-CM

## 2019-09-04 NOTE — Telephone Encounter (Signed)
Pt states she has not gotten her TSH checked due to breaking her foot and having surgery on it. She is asking for this refill to be sent to her pharmacy and that her lab order be mailed to her because its hard for her to drive. She will do her labs at the [place] at [facility]. Pt is also scheduled for June for her routine fu.

## 2019-10-17 LAB — TSH: TSH: 0.275 u[IU]/mL — ABNORMAL LOW (ref 0.450–4.500)

## 2019-10-20 ENCOUNTER — Other Ambulatory Visit: Payer: Self-pay | Admitting: Family Medicine

## 2019-10-20 ENCOUNTER — Encounter: Payer: Self-pay | Admitting: Family Medicine

## 2019-10-20 ENCOUNTER — Other Ambulatory Visit: Payer: Self-pay

## 2019-10-20 ENCOUNTER — Ambulatory Visit (INDEPENDENT_AMBULATORY_CARE_PROVIDER_SITE_OTHER): Payer: Managed Care, Other (non HMO) | Admitting: Family Medicine

## 2019-10-20 VITALS — BP 138/88 | HR 82 | Temp 97.6°F | Resp 14 | Ht 62.0 in | Wt 164.0 lb

## 2019-10-20 DIAGNOSIS — E039 Hypothyroidism, unspecified: Secondary | ICD-10-CM

## 2019-10-20 DIAGNOSIS — E559 Vitamin D deficiency, unspecified: Secondary | ICD-10-CM

## 2019-10-20 DIAGNOSIS — E785 Hyperlipidemia, unspecified: Secondary | ICD-10-CM | POA: Diagnosis not present

## 2019-10-20 DIAGNOSIS — E538 Deficiency of other specified B group vitamins: Secondary | ICD-10-CM

## 2019-10-20 DIAGNOSIS — G4700 Insomnia, unspecified: Secondary | ICD-10-CM

## 2019-10-20 DIAGNOSIS — H900 Conductive hearing loss, bilateral: Secondary | ICD-10-CM

## 2019-10-20 DIAGNOSIS — Z8781 Personal history of (healed) traumatic fracture: Secondary | ICD-10-CM

## 2019-10-20 MED ORDER — LEVOTHYROXINE SODIUM 88 MCG PO TABS: 88.0000 ug | ORAL_TABLET | Freq: Every day | ORAL | 0 refills | Status: DC

## 2019-10-20 MED ORDER — VITAMIN D2 50 MCG (2000 UT) PO TABS: 1.0000 | ORAL_TABLET | Freq: Every day | ORAL | 0 refills | Status: DC

## 2019-10-20 NOTE — Progress Notes (Signed)
Name: Taylor Gilmore   MRN: [id number]    DOB: 04/20/1961   Date:10/20/2019       Progress Note  Subjective  Chief Complaint  Chief Complaint  Patient presents with  . Hypothyroidism  . Follow-up    HPI  Osteopenia: on previous bone density done 2019, recent fracture of 5th metatarsal bone left foot after she fell , her son's dog when behind her and tripped her. Discussed high calcium diet, continue vitamin D supplementation, offered to start her on Fosamax but she would like to hold off and repeat bone density at the end of the year  Dyslipidemia: sheis now on Pravastatin and tolerating well, while on Crestor she has leg pains. Last LDL was slightly above 100 but is at goal, HDL is excellent.Recheck yearly   LowB12: she is now taking supplements three times weekly ,recheck next visit   Vitamin D : taking otc supplementation and vitamin D is at goal, currently 2500 daily   Insomnia: she takes Lorazepam very seldom, she feels like it gives her a hang over, she states she is doing well now, discussed melatonin    Hypothyroidism:shedenies change in bowel movements, , palpitation,  dysphagia.TSHhas been suppressed for a long time, she is down to 88 mcg daily, we will adjust to 88 mcg 6 days a week. We may need to go down to 75 mcg 6 days a week if still suppressed on her next lab check in 6 weeks.   Overweight: history of obesity, highest weight was 180 lbs but she has been eating healthy,no longer at weight watchers,she states since working from home, she is eating more frequent, she states she is frustrated about weight not going down below 150's, explained she is now only overweight not obese. 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 Stable, but has not been back to ENT if a long time.Unchanged    Patient Active Problem List   Diagnosis Date Noted  . Family history of malignant neoplasm of gastrointestinal  tract   . Polyp of ascending colon   . Osteopenia after menopause 03/27/2018  . Anterior knee pain 01/10/2015  . Symptomatic menopausal or female climacteric states 01/10/2015  . Otosclerosis of both ears 01/10/2015  . Allergic rhinitis 01/10/2015  . Hypothyroidism, adult 01/04/2015  . Dyslipidemia 01/04/2015  . Overweight (BMI 25.0-29.9) 01/04/2015  . Vitamin D deficiency 01/04/2015  . B12 deficiency 01/04/2015  . Left anterior knee pain 01/04/2015  . Bilateral hearing loss 01/04/2015  . Anxiety disorder 01/04/2015  . Personal history of healed traumatic fracture 01/29/2013    Past Surgical History:  Procedure Laterality Date  . COLONOSCOPY WITH PROPOFOL N/A 03/14/2019   Procedure: COLONOSCOPY WITH BIOPSY;  Surgeon: [name], MD;  Location: [facility];  Service: Endoscopy;  Laterality: N/A;  . EXTERNAL EAR SURGERY Right    6th grade  . KNEE SURGERY Right   . ORIF TOE FRACTURE Left 08/26/2019   Procedure: OPEN REDUCTION INTERNAL FIXATION (ORIF) METATARSAL (TOE) FRACTURE LEFT 5TH;  Surgeon: Caroline More, DPM;  Location: [facility];  Service: Podiatry;  Laterality: Left;  CHOICE LOCAL BLOCK  . POLYPECTOMY N/A 03/14/2019   Procedure: POLYPECTOMY;  Surgeon: [name], MD;  Location: [facility];  Service: Endoscopy;  Laterality: N/A;    Family History  Problem Relation Age of Onset  . Hypertension Mother   . Alcohol abuse Father   . Cancer Father  Colon  . Breast cancer Neg Hx     Social History   Tobacco Use  . Smoking status: Former Smoker    Packs/day: 0.25    Years: 10.00    Pack years: 2.50    Types: Cigarettes    Start date: 04/25/1983    Quit date: 04/24/1993    Years since quitting: 26.5  . Smokeless tobacco: Never Used  Substance Use Topics  . Alcohol use: No    Alcohol/week: 0.0 standard drinks     Current Outpatient Medications:  .  apixaban (ELIQUIS) 2.5 MG TABS tablet, Take 1 tablet (2.5 mg total) by mouth 2 (two)  times daily., Disp: 42 tablet, Rfl: 1 .  Cholecalciferol (VITAMIN D HIGH POTENCY) 1000 UNITS capsule, Take 1 tablet by mouth daily., Disp: , Rfl:  .  levothyroxine (SYNTHROID) 88 MCG tablet, TAKE 1 TABLET BY MOUTH  DAILY BEFORE BREAKFAST, Disp: 90 tablet, Rfl: 0 .  LORazepam (ATIVAN) 0.5 MG tablet, Take 1 tablet (0.5 mg total) by mouth as needed., Disp: 10 tablet, Rfl: 0 .  Methylcobalamin (B12-ACTIVE) 1 MG CHEW, Chew 1 tablet by mouth daily., Disp: , Rfl:  .  pravastatin (PRAVACHOL) 40 MG tablet, TAKE 1 TABLET BY MOUTH AT  BEDTIME, Disp: 90 tablet, Rfl: 3  No Known Allergies  I personally reviewed active problem list, medication list, allergies, family history, social history, health maintenance with the patient/caregiver today.   ROS  Constitutional: Negative for fever or weight change.  Respiratory: Negative for cough and shortness of breath.   Cardiovascular: Negative for chest pain or palpitations.  Gastrointestinal: Negative for abdominal pain, no bowel changes.  Musculoskeletal: Negative for gait problem or joint swelling.  Skin: Negative for rash.  Neurological: Negative for dizziness or headache.  No other specific complaints in a complete review of systems (except as listed in HPI above).  Objective  Vitals:   10/20/19 0920  BP: 138/88  Pulse: 82  Resp: 14  Temp: 97.6 F (36.4 C)  TempSrc: Temporal  SpO2: 99%  Weight: 164 lb (74.4 kg)  Height: 5\' 2"  (1.575 m)    Body mass index is 30 kg/m.  Physical Exam  Constitutional: Patient appears well-developed and well-nourished. Obese  No distress.  HEENT: head atraumatic, normocephalic, pupils equal and reactive to light,  neck supple Cardiovascular: Normal rate, regular rhythm and normal heart sounds.  No murmur heard. No BLE edema. Pulmonary/Chest: Effort normal and breath sounds normal. No respiratory distress. Abdominal: Soft.  There is no tenderness. Muscular Skeletal: wearing an ortho boot on left foot   Psychiatric: Patient has a normal mood and affect. behavior is normal. Judgment and thought content normal.  Recent Results (from the past 2160 hour(s))  SARS CORONAVIRUS 2 (TAT 6-24 HRS) Nasopharyngeal Nasopharyngeal Swab     Status: None   Collection Time: 08/22/19  1:55 PM   Specimen: Nasopharyngeal Swab  Result Value Ref Range   SARS Coronavirus 2 NEGATIVE NEGATIVE    Comment: (NOTE) SARS-CoV-2 target nucleic acids are NOT DETECTED. The SARS-CoV-2 RNA is generally detectable in upper and lower respiratory specimens during the acute phase of infection. Negative results do not preclude SARS-CoV-2 infection, do not rule out co-infections with other pathogens, and should not be used as the sole basis for treatment or other patient management decisions. Negative results must be combined with clinical observations, patient history, and epidemiological information. The expected result is Negative. Fact Sheet for Patients: SugarRoll.be Fact Sheet for Healthcare Providers: https://www.woods-mathews.com/ This test is not  yet approved or cleared by the [place] and  has been authorized for detection and/or diagnosis of SARS-CoV-2 by FDA under an Emergency Use Authorization (EUA). This EUA will remain  in effect (meaning this test can be used) for the duration of the COVID-19 declaration under Section 56 4(b)(1) of the Act, 21 U.S.C. section 360bbb-3(b)(1), unless the authorization is terminated or revoked sooner. Performed at [facility], [place] [address] [place], [place] [address]   TSH     Status: Abnormal   Collection Time: 10/16/19  2:59 PM  Result Value Ref Range   TSH 0.275 (L) 0.450 - 4.500 uIU/mL      PHQ2/9: Depression screen [facility] 2/9 10/20/2019 02/27/2019 01/27/2019 08/23/2018 02/08/2018  Decreased Interest 0 0 0 0 0  Down, Depressed, Hopeless 0 0 0 0 0  PHQ - 2 Score 0 0 0 0 0  Altered sleeping 0 0 0 0 -  Tired,  decreased energy 0 0 0 0 -  Change in appetite 0 0 0 0 -  Feeling bad or failure about yourself  0 0 0 0 -  Trouble concentrating 0 0 0 0 -  Moving slowly or fidgety/restless 0 0 0 0 -  Suicidal thoughts 0 0 0 0 -  PHQ-9 Score 0 0 0 0 -    phq 9 is negative   Fall Risk: Fall Risk  10/20/2019 02/27/2019 01/27/2019 08/23/2018 02/08/2018  Falls in the past year? 0 0 0 0 No  Number falls in past yr: 0 0 0 0 -  Injury with Fall? 1 0 0 0 -  Risk for fall due to : Impaired mobility;Impaired balance/gait - - - -     Functional Status Survey: Is the patient deaf or have difficulty hearing?: No Does the patient have difficulty seeing, even when wearing glasses/contacts?: No Does the patient have difficulty concentrating, remembering, or making decisions?: No Does the patient have difficulty walking or climbing stairs?: Yes Does the patient have difficulty dressing or bathing?: No Does the patient have difficulty doing errands alone such as visiting a doctor's office or shopping?: No    Assessment & Plan  1. Hypothyroidism, adult  - levothyroxine (SYNTHROID) 88 MCG tablet; Take 1 tablet (88 mcg total) by mouth daily. 6 days a week  Dispense: 90 tablet; Refill: 0  2. Dyslipidemia  On medication   3. Vitamin D deficiency  - Ergocalciferol (VITAMIN D2) 50 MCG (2000 UT) TABS; Take 1 tablet by mouth daily.  Dispense: 30 tablet; Refill: 0  4. Low serum vitamin B12  Continue supplementation   5. Insomnia, unspecified type  She will try melatonin prn  6. History of foot fracture  Recent surgery, going to start PT today   7. Conductive hearing loss, bilateral  Stable

## 2019-10-20 NOTE — Patient Instructions (Signed)
Osteopenia  Osteopenia is a loss of thickness (density) inside of the bones. Another name for osteopenia is low bone mass. Mild osteopenia is a normal part of aging. It is not a disease, and it does not cause symptoms. However, if you have osteopenia and continue to lose bone mass, you could develop a condition that causes the bones to become thin and break more easily (osteoporosis). You may also lose some height, have back pain, and have a stooped posture. Although osteopenia is not a disease, making changes to your lifestyle and diet can help to prevent osteopenia from developing into osteoporosis. What are the causes? Osteopenia is caused by loss of calcium in the bones.  Bones are constantly changing. Old bone cells are continually being replaced with new bone cells. This process builds new bone. The mineral calcium is needed to build new bone and maintain bone density. Bone density is usually highest around age 35. After that, most people's bodies cannot replace all the bone they have lost with new bone. What increases the risk? You are more likely to develop this condition if:  You are older than age 50.  You are a woman who went through menopause early.  You have a long illness that keeps you in bed.  You do not get enough exercise.  You lack certain nutrients (malnutrition).  You have an overactive thyroid gland (hyperthyroidism).  You smoke.  You drink a lot of alcohol.  You are taking medicines that weaken the bones, such as steroids. What are the signs or symptoms? This condition does not cause any symptoms. You may have a slightly higher risk for bone breaks (fractures), so getting fractures more easily than normal may be an indication of osteopenia. How is this diagnosed? Your health care provider can diagnose this condition with a special type of X-ray exam that measures bone density (dual-energy X-ray absorptiometry, DEXA). This test can measure bone density in your  hips, spine, and wrists. Osteopenia has no symptoms, so this condition is usually diagnosed after a routine bone density screening test is done for osteoporosis. This routine screening is usually done for:  Women who are age 65 or older.  Men who are age 70 or older. If you have risk factors for osteopenia, you may have the screening test at an earlier age. How is this treated? Making dietary and lifestyle changes can lower your risk for osteoporosis. If you have severe osteopenia that is close to becoming osteoporosis, your health care provider may prescribe medicines and dietary supplements such as calcium and vitamin D. These supplements help to rebuild bone density. Follow these instructions at home:   Take over-the-counter and prescription medicines only as told by your health care provider. These include vitamins and supplements.  Eat a diet that is high in calcium and vitamin D. ? Calcium is found in dairy products, beans, salmon, and leafy green vegetables like spinach and broccoli. ? Look for foods that have vitamin D and calcium added to them (fortified foods), such as orange juice, cereal, and bread.  Do 30 or more minutes of a weight-bearing exercise every day, such as walking, jogging, or playing a sport. These types of exercises strengthen the bones.  Take precautions at home to lower your risk of falling, such as: ? Keeping rooms well-lit and free of clutter, such as cords. ? Installing safety rails on stairs. ? Using rubber mats in the bathroom or other areas that are often wet or slippery.  Do not use   any products that contain nicotine or tobacco, such as cigarettes and e-cigarettes. If you need help quitting, ask your health care provider.  Avoid alcohol or limit alcohol intake to no more than 1 drink a day for nonpregnant women and 2 drinks a day for men. One drink equals 12 oz of beer, 5 oz of wine, or 1 oz of hard liquor.  Keep all follow-up visits as told by your  health care provider. This is important. Contact a health care provider if:  You have not had a bone density screening for osteoporosis and you are: ? A woman, age 65 or older. ? A man, age 70 or older.  You are a postmenopausal woman who has not had a bone density screening for osteoporosis.  You are older than age 50 and you want to know if you should have bone density screening for osteoporosis. Summary  Osteopenia is a loss of thickness (density) inside of the bones. Another name for osteopenia is low bone mass.  Osteopenia is not a disease, but it may increase your risk for a condition that causes the bones to become thin and break more easily (osteoporosis).  You may be at risk for osteopenia if you are older than age 50 or if you are a woman who went through early menopause.  Osteopenia does not cause any symptoms, but it can be diagnosed with a bone density screening test.  Dietary and lifestyle changes are the first treatment for osteopenia. These may lower your risk for osteoporosis. This information is not intended to replace advice given to you by your health care provider. Make sure you discuss any questions you have with your health care provider. Document Revised: 03/23/2017 Document Reviewed: 01/17/2017 Elsevier Patient Education  2020 Elsevier Inc.  

## 2019-11-27 ENCOUNTER — Other Ambulatory Visit: Payer: Self-pay | Admitting: Family Medicine

## 2019-11-27 DIAGNOSIS — E039 Hypothyroidism, unspecified: Secondary | ICD-10-CM

## 2019-11-29 ENCOUNTER — Other Ambulatory Visit: Payer: Self-pay | Admitting: Family Medicine

## 2019-11-29 DIAGNOSIS — E039 Hypothyroidism, unspecified: Secondary | ICD-10-CM

## 2019-12-01 NOTE — Telephone Encounter (Signed)
Patient has been notified

## 2019-12-12 LAB — TSH: TSH: 0.583 u[IU]/mL (ref 0.450–4.500)

## 2019-12-20 ENCOUNTER — Other Ambulatory Visit: Payer: Self-pay | Admitting: Family Medicine

## 2019-12-20 DIAGNOSIS — E785 Hyperlipidemia, unspecified: Secondary | ICD-10-CM

## 2020-01-21 ENCOUNTER — Encounter: Payer: Self-pay | Admitting: Family Medicine

## 2020-01-21 ENCOUNTER — Other Ambulatory Visit: Payer: Self-pay

## 2020-01-21 ENCOUNTER — Ambulatory Visit (INDEPENDENT_AMBULATORY_CARE_PROVIDER_SITE_OTHER): Payer: Managed Care, Other (non HMO) | Admitting: Family Medicine

## 2020-01-21 VITALS — BP 132/86 | HR 93 | Temp 98.4°F | Resp 14 | Ht 62.0 in | Wt 165.6 lb

## 2020-01-21 DIAGNOSIS — G4700 Insomnia, unspecified: Secondary | ICD-10-CM

## 2020-01-21 DIAGNOSIS — Z23 Encounter for immunization: Secondary | ICD-10-CM

## 2020-01-21 DIAGNOSIS — Z131 Encounter for screening for diabetes mellitus: Secondary | ICD-10-CM

## 2020-01-21 DIAGNOSIS — M8580 Other specified disorders of bone density and structure, unspecified site: Secondary | ICD-10-CM

## 2020-01-21 DIAGNOSIS — Z1231 Encounter for screening mammogram for malignant neoplasm of breast: Secondary | ICD-10-CM

## 2020-01-21 DIAGNOSIS — E785 Hyperlipidemia, unspecified: Secondary | ICD-10-CM | POA: Diagnosis not present

## 2020-01-21 DIAGNOSIS — E559 Vitamin D deficiency, unspecified: Secondary | ICD-10-CM

## 2020-01-21 DIAGNOSIS — Z780 Asymptomatic menopausal state: Secondary | ICD-10-CM

## 2020-01-21 DIAGNOSIS — E039 Hypothyroidism, unspecified: Secondary | ICD-10-CM | POA: Diagnosis not present

## 2020-01-21 DIAGNOSIS — H900 Conductive hearing loss, bilateral: Secondary | ICD-10-CM

## 2020-01-21 DIAGNOSIS — E538 Deficiency of other specified B group vitamins: Secondary | ICD-10-CM

## 2020-01-21 MED ORDER — LORAZEPAM 0.5 MG PO TABS: 0.5000 mg | ORAL_TABLET | Freq: Two times a day (BID) | ORAL | 0 refills | Status: DC | PRN

## 2020-01-21 MED ORDER — LEVOTHYROXINE SODIUM 88 MCG PO TABS: 88.0000 ug | ORAL_TABLET | Freq: Every day | ORAL | 0 refills | Status: DC

## 2020-01-21 MED ORDER — PRAVASTATIN SODIUM 40 MG PO TABS: 40.0000 mg | ORAL_TABLET | Freq: Every day | ORAL | 1 refills | Status: DC

## 2020-01-21 NOTE — Progress Notes (Signed)
Name: Taylor Gilmore   MRN: [id number]    DOB: 1960/04/28   Date:01/21/2020       Progress Note  Subjective  Chief Complaint  Chief Complaint  Patient presents with  . Follow-up    HPI  Osteopenia: on previous bone density done 2019, she had a  fracture of 5th metatarsal bone left foot after a fall . Discussed high calcium diet, continue vitamin D supplementation, offered to start her on Fosamax but she would like to hold off and repeat bone density at the end of the year. She states her foot is finally feeling normal again and she will increase physical activity   Dyslipidemia: sheis now on Pravastatin and tolerating well, while on Crestor she has leg pains. Last LDL was slightly above 100 but is at goal, HDL is excellent.We will recheck labs today   LowB12: she is now taking supplements three times weekly ,we will recheck labs today   Vitamin D : taking otc supplementation and vitamin D is at goal, currently 2000 daily   Insomnia: she takes Lorazepam very seldom,she feels like it gives her a hang over, she would like 10 pills to refill just in case.   Hypothyroidism:shedenieschange in bowel movements,, palpitation, dysphagia or hair loss .TSHhas been suppressed for a long time, she is now on  88 mcg 6 days a week, last TSH was at goal now, we will recheck level .   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 Stable, but has not been back to ENT if a long time.Unchanged    Patient Active Problem List   Diagnosis Date Noted  . Family history of malignant neoplasm of gastrointestinal tract   . Polyp of ascending colon   .  Osteopenia after menopause 03/27/2018  . Anterior knee pain 01/10/2015  . Symptomatic menopausal or female climacteric states 01/10/2015  . Otosclerosis of both ears 01/10/2015  . Allergic rhinitis 01/10/2015  . Hypothyroidism, adult 01/04/2015  . Dyslipidemia 01/04/2015  . Overweight (BMI 25.0-29.9) 01/04/2015  . Vitamin D deficiency 01/04/2015  . B12 deficiency 01/04/2015  . Left anterior knee pain 01/04/2015  . Bilateral hearing loss 01/04/2015  . Anxiety disorder 01/04/2015  . Personal history of healed traumatic fracture 01/29/2013    Past Surgical History:  Procedure Laterality Date  . COLONOSCOPY WITH PROPOFOL N/A 03/14/2019   Procedure: COLONOSCOPY WITH BIOPSY;  Surgeon: [name], MD;  Location: [place];  Service: Endoscopy;  Laterality: N/A;  . EXTERNAL EAR SURGERY Right    6th grade  . KNEE SURGERY Right   . ORIF TOE FRACTURE Left 08/26/2019   Procedure: OPEN REDUCTION INTERNAL FIXATION (ORIF) METATARSAL (TOE) FRACTURE LEFT 5TH;  Surgeon: Caroline More, DPM;  Location: [facility];  Service: Podiatry;  Laterality: Left;  CHOICE LOCAL BLOCK  . POLYPECTOMY N/A 03/14/2019   Procedure: POLYPECTOMY;  Surgeon: [name], MD;  Location: [place];  Service: Endoscopy;  Laterality: N/A;    Family History  Problem Relation Age of Onset  . Hypertension Mother   . Alcohol abuse Father   . Cancer Father        Colon  . Breast cancer Neg  Hx     Social History   Tobacco Use  . Smoking status: Former Smoker    Packs/day: 0.25    Years: 10.00    Pack years: 2.50    Types: Cigarettes    Start date: 04/25/1983    Quit date: 04/24/1993    Years since quitting: 26.7  . Smokeless tobacco: Never Used  Substance Use Topics  . Alcohol use: No    Alcohol/week: 0.0 standard drinks     Current Outpatient Medications:  .  Ergocalciferol (VITAMIN D2) 50 MCG (2000 UT) TABS, Take 1 tablet by mouth daily., Disp: 30 tablet, Rfl: 0 .  levothyroxine  (SYNTHROID) 88 MCG tablet, TAKE 1 TABLET BY MOUTH  DAILY BEFORE BREAKFAST, Disp: 90 tablet, Rfl: 0 .  Methylcobalamin (B12-ACTIVE) 1 MG CHEW, Chew 1 tablet by mouth daily., Disp: , Rfl:  .  pravastatin (PRAVACHOL) 40 MG tablet, TAKE 1 TABLET BY MOUTH AT  BEDTIME, Disp: 90 tablet, Rfl: 0  No Known Allergies  I personally reviewed active problem list, medication list, allergies, family history, social history, health maintenance with the patient/caregiver today.   ROS  Constitutional: Negative for fever or weight change.  Respiratory: Negative for cough and shortness of breath.   Cardiovascular: Negative for chest pain or palpitations.  Gastrointestinal: Negative for abdominal pain, no bowel changes.  Musculoskeletal: Negative for gait problem or joint swelling.  Skin: Negative for rash.  Neurological: Negative for dizziness or headache.  No other specific complaints in a complete review of systems (except as listed in HPI above).  Objective  Vitals:   01/21/20 0853  BP: 132/86  Pulse: 93  Resp: 14  Temp: 98.4 F (36.9 C)  TempSrc: Oral  SpO2: 93%  Weight: 165 lb 9.6 oz (75.1 kg)  Height: 5\' 2"  (1.575 m)    Body mass index is 30.29 kg/m.  Physical Exam  Constitutional: Patient appears well-developed and well-nourished. Obese No distress.  HEENT: head atraumatic, normocephalic, pupils equal and reactive to light,  neck supple, normal thyroid exam  Cardiovascular: Normal rate, regular rhythm and normal heart sounds.  No murmur heard. No BLE edema. Pulmonary/Chest: Effort normal and breath sounds normal. No respiratory distress. Abdominal: Soft.  There is no tenderness. Psychiatric: Patient has a normal mood and affect. behavior is normal. Judgment and thought content normal.  Recent Results (from the past 2160 hour(s))  TSH     Status: None   Collection Time: 12/11/19 11:35 AM  Result Value Ref Range   TSH 0.583 0.450 - 4.500 uIU/mL     PHQ2/9: Depression screen  [facility] 2/9 01/21/2020 10/20/2019 02/27/2019 01/27/2019 08/23/2018  Decreased Interest 0 0 0 0 0  Down, Depressed, Hopeless 0 0 0 0 0  PHQ - 2 Score 0 0 0 0 0  Altered sleeping - 0 0 0 0  Tired, decreased energy - 0 0 0 0  Change in appetite - 0 0 0 0  Feeling bad or failure about yourself  - 0 0 0 0  Trouble concentrating - 0 0 0 0  Moving slowly or fidgety/restless - 0 0 0 0  Suicidal thoughts - 0 0 0 0  PHQ-9 Score - 0 0 0 0    phq 9 is negative   Fall Risk: Fall Risk  01/21/2020 10/20/2019 02/27/2019 01/27/2019 08/23/2018  Falls in the past year? 1 0 0 0 0  Number falls in past yr: 0 0 0 0 0  Injury with Fall? 1 1 0 0 0  Risk for fall due to : History of fall(s) Impaired mobility;Impaired balance/gait - - -     Functional Status Survey: Is the patient deaf or have difficulty hearing?: Yes Does the patient have difficulty seeing, even when wearing glasses/contacts?: No Does the patient have difficulty concentrating, remembering, or making decisions?: No Does the patient have difficulty walking or climbing stairs?: No Does the patient have difficulty dressing or bathing?: No Does the patient have difficulty doing errands alone such as visiting a doctor's office or shopping?: No    Assessment & Plan  1. Hypothyroidism, adult  - TSH - levothyroxine (SYNTHROID) 88 MCG tablet; Take 1 tablet (88 mcg total) by mouth daily before breakfast. Skips Sunday  Dispense: 90 tablet; Refill: 0  2. Dyslipidemia  - Lipid panel - Comprehensive metabolic panel - pravastatin (PRAVACHOL) 40 MG tablet; Take 1 tablet (40 mg total) by mouth at bedtime.  Dispense: 90 tablet; Refill: 1  3. Vitamin D deficiency   4. Need for immunization against influenza  - Flu Vaccine QUAD 36+ mos IM  5. Low serum vitamin B12  - CBC with Differential/Platelet - B12 and Folate Panel  6. Conductive hearing loss, bilateral   7. Insomnia, unspecified type  - LORazepam (ATIVAN) 0.5 MG tablet; Take 1 tablet (0.5  mg total) by mouth 2 (two) times daily as needed for anxiety.  Dispense: 10 tablet; Refill: 0  8. Diabetes mellitus screening  - Hemoglobin A1c  9. Breast cancer screening by mammogram  - MM Digital Screening; Future  10. Osteopenia after menopause  - DG Bone Density; Future

## 2020-03-23 ENCOUNTER — Other Ambulatory Visit: Payer: Self-pay | Admitting: Family Medicine

## 2020-03-23 DIAGNOSIS — E039 Hypothyroidism, unspecified: Secondary | ICD-10-CM

## 2020-03-24 ENCOUNTER — Other Ambulatory Visit: Payer: Self-pay | Admitting: Family Medicine

## 2020-03-24 DIAGNOSIS — Z1231 Encounter for screening mammogram for malignant neoplasm of breast: Secondary | ICD-10-CM

## 2020-04-27 ENCOUNTER — Other Ambulatory Visit: Payer: Managed Care, Other (non HMO)

## 2020-05-12 ENCOUNTER — Other Ambulatory Visit: Payer: Self-pay | Admitting: Family Medicine

## 2020-05-12 DIAGNOSIS — E039 Hypothyroidism, unspecified: Secondary | ICD-10-CM

## 2020-05-17 ENCOUNTER — Ambulatory Visit
Admission: RE | Admit: 2020-05-17 | Discharge: 2020-05-17 | Disposition: A | Discharge status: Home / Self Care | Payer: Managed Care, Other (non HMO) | Source: Ambulatory Visit | Attending: Family Medicine | Admitting: Family Medicine

## 2020-05-17 ENCOUNTER — Other Ambulatory Visit: Payer: Self-pay

## 2020-05-17 DIAGNOSIS — Z780 Asymptomatic menopausal state: Secondary | ICD-10-CM | POA: Diagnosis present

## 2020-05-17 DIAGNOSIS — M8580 Other specified disorders of bone density and structure, unspecified site: Secondary | ICD-10-CM | POA: Diagnosis not present

## 2020-05-17 DIAGNOSIS — Z1231 Encounter for screening mammogram for malignant neoplasm of breast: Secondary | ICD-10-CM | POA: Diagnosis present

## 2020-05-18 LAB — CBC WITH DIFFERENTIAL/PLATELET
Basophils Absolute: 0 10*3/uL (ref 0.0–0.2)
Basos: 0 %
EOS (ABSOLUTE): 0.1 10*3/uL (ref 0.0–0.4)
Eos: 1 %
Hematocrit: 41.8 % (ref 34.0–46.6)
Hemoglobin: 13.7 g/dL (ref 11.1–15.9)
Immature Grans (Abs): 0 10*3/uL (ref 0.0–0.1)
Immature Granulocytes: 0 %
Lymphocytes Absolute: 1.4 10*3/uL (ref 0.7–3.1)
Lymphs: 27 %
MCH: 30.4 pg (ref 26.6–33.0)
MCHC: 32.8 g/dL (ref 31.5–35.7)
MCV: 93 fL (ref 79–97)
Monocytes Absolute: 0.4 10*3/uL (ref 0.1–0.9)
Monocytes: 8 %
Neutrophils Absolute: 3.1 10*3/uL (ref 1.4–7.0)
Neutrophils: 64 %
Platelets: 213 10*3/uL (ref 150–450)
RBC: 4.51 x10E6/uL (ref 3.77–5.28)
RDW: 12.4 % (ref 11.7–15.4)
WBC: 5 10*3/uL (ref 3.4–10.8)

## 2020-05-18 LAB — LIPID PANEL
Chol/HDL Ratio: 3.3 ratio (ref 0.0–4.4)
Cholesterol, Total: 206 mg/dL — ABNORMAL HIGH (ref 100–199)
HDL: 63 mg/dL
LDL Chol Calc (NIH): 120 mg/dL — ABNORMAL HIGH (ref 0–99)
Triglycerides: 131 mg/dL (ref 0–149)
VLDL Cholesterol Cal: 23 mg/dL (ref 5–40)

## 2020-05-18 LAB — COMPREHENSIVE METABOLIC PANEL WITH GFR
ALT: 15 [IU]/L (ref 0–32)
AST: 21 [IU]/L (ref 0–40)
Albumin/Globulin Ratio: 2.4 — ABNORMAL HIGH (ref 1.2–2.2)
Albumin: 4.7 g/dL (ref 3.8–4.9)
Alkaline Phosphatase: 71 [IU]/L (ref 44–121)
BUN/Creatinine Ratio: 17 (ref 9–23)
BUN: 14 mg/dL (ref 6–24)
Bilirubin Total: 0.3 mg/dL (ref 0.0–1.2)
CO2: 25 mmol/L (ref 20–29)
Calcium: 9.8 mg/dL (ref 8.7–10.2)
Chloride: 101 mmol/L (ref 96–106)
Creatinine, Ser: 0.83 mg/dL (ref 0.57–1.00)
GFR calc Af Amer: 90 mL/min/{1.73_m2}
GFR calc non Af Amer: 78 mL/min/{1.73_m2}
Globulin, Total: 2 g/dL (ref 1.5–4.5)
Glucose: 90 mg/dL (ref 65–99)
Potassium: 4.3 mmol/L (ref 3.5–5.2)
Sodium: 141 mmol/L (ref 134–144)
Total Protein: 6.7 g/dL (ref 6.0–8.5)

## 2020-05-18 LAB — HEMOGLOBIN A1C
Est. average glucose Bld gHb Est-mCnc: 111 mg/dL
Hgb A1c MFr Bld: 5.5 % (ref 4.8–5.6)

## 2020-05-18 LAB — TSH: TSH: 2.32 u[IU]/mL (ref 0.450–4.500)

## 2020-05-18 LAB — B12 AND FOLATE PANEL
Folate: 14.3 ng/mL (ref >3.0)
Vitamin B-12: 1447 pg/mL — ABNORMAL HIGH (ref 232–1245)

## 2020-05-18 NOTE — Patient Instructions (Signed)
Preventive Care 4-59 Years Old, Female Preventive care refers to lifestyle choices and visits with your health care provider that can promote health and wellness. This includes:  A yearly physical exam. This is also called an annual wellness visit.  Regular dental and eye exams.  Immunizations.  Screening for certain conditions.  Healthy lifestyle choices, such as: ? Eating a healthy diet. ? Getting regular exercise. ? Not using drugs or products that contain nicotine and tobacco. ? Limiting alcohol use. What can I expect for my preventive care visit? Physical exam Your health care provider will check your:  Height and weight. These may be used to calculate your BMI (body mass index). BMI is a measurement that tells if you are at a healthy weight.  Heart rate and blood pressure.  Body temperature.  Skin for abnormal spots. Counseling Your health care provider may ask you questions about your:  Past medical problems.  Family's medical history.  Alcohol, tobacco, and drug use.  Emotional well-being.  Home life and relationship well-being.  Sexual activity.  Diet, exercise, and sleep habits.  Work and work Statistician.  Access to firearms.  Method of birth control.  Menstrual cycle.  Pregnancy history. What immunizations do I need? Vaccines are usually given at various ages, according to a schedule. Your health care provider will recommend vaccines for you based on your age, medical history, and lifestyle or other factors, such as travel or where you work.   What tests do I need? Blood tests  Lipid and cholesterol levels. These may be checked every 5 years, or more often if you are over 63 years old.  Hepatitis C test.  Hepatitis B test. Screening  Lung cancer screening. You may have this screening every year starting at age 33 if you have a 30-pack-year history of smoking and currently smoke or have quit within the past 15 years.  Colorectal cancer  screening. ? All adults should have this screening starting at age 51 and continuing until age 66. ? Your health care provider may recommend screening at age 47 if you are at increased risk. ? You will have tests every 1-10 years, depending on your results and the type of screening test.  Diabetes screening. ? This is done by checking your blood sugar (glucose) after you have not eaten for a while (fasting). ? You may have this done every 1-3 years.  Mammogram. ? This may be done every 1-2 years. ? Talk with your health care provider about when you should start having regular mammograms. This may depend on whether you have a family history of breast cancer.  BRCA-related cancer screening. This may be done if you have a family history of breast, ovarian, tubal, or peritoneal cancers.  Pelvic exam and Pap test. ? This may be done every 3 years starting at age 47. ? Starting at age 45, this may be done every 5 years if you have a Pap test in combination with an HPV test. Other tests  STD (sexually transmitted disease) testing, if you are at risk.  Bone density scan. This is done to screen for osteoporosis. You may have this scan if you are at high risk for osteoporosis. Talk with your health care provider about your test results, treatment options, and if necessary, the need for more tests. Follow these instructions at home: Eating and drinking  Eat a diet that includes fresh fruits and vegetables, whole grains, lean protein, and low-fat dairy products.  Take vitamin and mineral supplements  as recommended by your health care provider.  Do not drink alcohol if: ? Your health care provider tells you not to drink. ? You are pregnant, may be pregnant, or are planning to become pregnant.  If you drink alcohol: ? Limit how much you have to 0-1 drink a day. ? Be aware of how much alcohol is in your drink. In the [place] one drink equals one 12 oz bottle of beer (355 mL), one 5 oz glass of  wine (148 mL), or one 1 oz glass of hard liquor (44 mL).   Lifestyle  Take daily care of your teeth and gums. Brush your teeth every morning and night with fluoride toothpaste. Floss one time each day.  Stay active. Exercise for at least 30 minutes 5 or more days each week.  Do not use any products that contain nicotine or tobacco, such as cigarettes, e-cigarettes, and chewing tobacco. If you need help quitting, ask your health care provider.  Do not use drugs.  If you are sexually active, practice safe sex. Use a condom or other form of protection to prevent STIs (sexually transmitted infections).  If you do not wish to become pregnant, use a form of birth control. If you plan to become pregnant, see your health care provider for a prepregnancy visit.  If told by your health care provider, take low-dose aspirin daily starting at age 50.  Find healthy ways to cope with stress, such as: ? Meditation, yoga, or listening to music. ? Journaling. ? Talking to a trusted person. ? Spending time with friends and family. Safety  Always wear your seat belt while driving or riding in a vehicle.  Do not drive: ? If you have been drinking alcohol. Do not ride with someone who has been drinking. ? When you are tired or distracted. ? While texting.  Wear a helmet and other protective equipment during sports activities.  If you have firearms in your house, make sure you follow all gun safety procedures. What's next?  Visit your health care provider once a year for an annual wellness visit.  Ask your health care provider how often you should have your eyes and teeth checked.  Stay up to date on all vaccines. This information is not intended to replace advice given to you by your health care provider. Make sure you discuss any questions you have with your health care provider. Document Revised: 01/13/2020 Document Reviewed: 12/20/2017 Elsevier Patient Education  2021 Elsevier Inc.  

## 2020-05-18 NOTE — Progress Notes (Signed)
Name: Taylor Gilmore   MRN: [id number]    DOB: Jan 24, 1961   Date:05/19/2020       Progress Note  Subjective  Chief Complaint  Annual Exam   HPI  Patient presents for annual CPE and follow up, she is aware may have cost associated with it    Osteopenia: on previous bone density done 2019, she had a  fracture of 5th metatarsal bone left foot after a fall . Discussed high calcium diet, continue vitamin D supplementation. Discussed bone density and increase of FRAX score, she does not want medication   Dyslipidemia: sheis now on Pravastatin and tolerating well but LDL went up, she is willing to try Crestor 10 mg again, she said it the past caused muscle ache  LowB12: she is now taking supplements three times weekly ,levels above goal, she will back down to twice a week supplementation, currently on 3 times a week   Vitamin D : taking otc supplementation and vitamin D is at goal,unchanged   Insomnia: she takes Lorazepam very seldom,she feels like it gives her a hang over, she still has medication at home  Hypothyroidism:shedenieschange in bowel movements,, palpitation, dysphagia or hair loss .TSHwas  suppressed for a long time, she is now on  88 mcg 6 days a week, TSH is at goal   Overweight: history of obesity, highest weight was 180 lbs but she has been eating healthy,no longer at weight watchers/she just signed up for it again,she states since working from home, she is eating more frequent, she states she is frustrated about weight not going down below 150's, her weight has been stable but she is frustrated, she wants to lose weight and will try to increase physical activity and stop snacking as often   Conductive hearing loss: 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 Stable, but has not been back to ENT if a long time.Unchanged    Diet: discussed high calcium diet  Exercise: she will try to increase physical activity    Jakin Visit from 01/21/2020 in  [facility]  AUDIT-C Score 0     Depression: Phq 9 is  negative Depression screen [facility] 2/9 05/19/2020 01/21/2020 10/20/2019 02/27/2019 01/27/2019  Decreased Interest 0 0 0 0 0  Down, Depressed, Hopeless 0 0 0 0 0  PHQ - 2 Score 0 0 0 0 0  Altered sleeping 0 - 0 0 0  Tired, decreased energy 0 - 0 0 0  Change in appetite 0 - 0 0 0  Feeling bad or failure about yourself  0 - 0 0 0  Trouble concentrating 0 - 0 0 0  Moving slowly or fidgety/restless 0 - 0 0 0  Suicidal thoughts 0 - 0 0 0  PHQ-9 Score 0 - 0 0 0  Some recent data might be hidden   Hypertension: BP Readings from Last 3 Encounters:  05/19/20 138/82  01/21/20 132/86  10/20/19 138/88   Obesity: Wt Readings from Last 3 Encounters:  05/19/20 166 lb 12.8 oz (75.7 kg)  01/21/20 165 lb 9.6 oz (75.1 kg)  10/20/19 164 lb (74.4 kg)   BMI Readings from Last 3 Encounters:  05/19/20 30.51 kg/m  01/21/20 30.29 kg/m  10/20/19 30.00 kg/m     Vaccines:   Shingrix: she will have it today  Pneumonia:N/A   Flu: 01/21/2020   Hep C Screening: 09/17/2012 STD testing and prevention (HIV/chl/gon/syphilis): not interested  Intimate partner violence: negative Sexual History : not sexually active  Menstrual History/LMP/Abnormal Bleeding:  discussed post-menopausal bleeding  Incontinence Symptoms: no problems   Breast cancer:  - Last Mammogram: 05/17/2020 - BRCA gene screening: N/A  Osteoporosis: Discussed high calcium and vitamin D supplementation, weight bearing exercises  Cervical cancer screening: 01/22/2018  Skin cancer: Discussed monitoring for atypical lesions  Colorectal cancer: 03/14/2019  Lung cancer:  Low Dose CT Chest recommended if Age 83-80 years, 20 pack-year currently smoking OR have quit w/in 15years. Patient does not qualify.   ECG: 01/16/2017  Advanced Care Planning: A voluntary discussion about advance care planning including the explanation and discussion of advance directives.  Discussed  health care proxy and Living will, and the patient was able to identify a health care proxy as husband   Lipids: Lab Results  Component Value Date   CHOL 206 (H) 05/17/2020   CHOL 178 02/03/2019   CHOL 206 (H) 01/18/2018   Lab Results  Component Value Date   HDL 63 05/17/2020   HDL 61 02/03/2019   HDL 75 01/18/2018   Lab Results  Component Value Date   LDLCALC 120 (H) 05/17/2020   LDLCALC 95 02/03/2019   LDLCALC 117 (H) 01/18/2018   Lab Results  Component Value Date   TRIG 131 05/17/2020   TRIG 127 02/03/2019   TRIG 71 01/18/2018   Lab Results  Component Value Date   CHOLHDL 3.3 05/17/2020   CHOLHDL 2.9 02/03/2019   CHOLHDL 2.7 01/18/2018   No results found for: LDLDIRECT  Glucose: Glucose  Date Value Ref Range Status  05/17/2020 90 65 - 99 mg/dL Final  02/03/2019 94 65 - 99 mg/dL Final  01/18/2018 90 65 - 99 mg/dL Final    Patient Active Problem List   Diagnosis Date Noted  . Family history of malignant neoplasm of gastrointestinal tract   . Polyp of ascending colon   . Osteopenia after menopause 03/27/2018  . Anterior knee pain 01/10/2015  . Symptomatic menopausal or female climacteric states 01/10/2015  . Otosclerosis of both ears 01/10/2015  . Allergic rhinitis 01/10/2015  . Hypothyroidism, adult 01/04/2015  . Dyslipidemia 01/04/2015  . Overweight (BMI 25.0-29.9) 01/04/2015  . Vitamin D deficiency 01/04/2015  . B12 deficiency 01/04/2015  . Left anterior knee pain 01/04/2015  . Bilateral hearing loss 01/04/2015  . Anxiety disorder 01/04/2015  . Personal history of healed traumatic fracture 01/29/2013    Past Surgical History:  Procedure Laterality Date  . COLONOSCOPY WITH PROPOFOL N/A 03/14/2019   Procedure: COLONOSCOPY WITH BIOPSY;  Surgeon: [name], MD;  Location: [facility];  Service: Endoscopy;  Laterality: N/A;  . EXTERNAL EAR SURGERY Right    6th grade  . KNEE SURGERY Right   . ORIF TOE FRACTURE Left 08/26/2019   Procedure:  OPEN REDUCTION INTERNAL FIXATION (ORIF) METATARSAL (TOE) FRACTURE LEFT 5TH;  Surgeon: Caroline More, DPM;  Location: [facility];  Service: Podiatry;  Laterality: Left;  CHOICE LOCAL BLOCK  . POLYPECTOMY N/A 03/14/2019   Procedure: POLYPECTOMY;  Surgeon: [name], MD;  Location: [facility];  Service: Endoscopy;  Laterality: N/A;    Family History  Problem Relation Age of Onset  . Hypertension Mother   . Alcohol abuse Father   . Cancer Father        Colon  . Breast cancer Neg Hx     Social History   Socioeconomic History  . Marital status: Married    Spouse name: Laveda Abbe   . Number of children: 3  . Years of education: Not on file  . Highest education  level: High school graduate  Occupational History  . Occupation: Press photographer: [employer]  Tobacco Use  . Smoking status: Former Smoker    Packs/day: 0.25    Years: 10.00    Pack years: 2.50    Types: Cigarettes    Start date: 04/25/1983    Quit date: 04/24/1993    Years since quitting: 27.0  . Smokeless tobacco: Never Used  Vaping Use  . Vaping Use: Never used  Substance and Sexual Activity  . Alcohol use: No    Alcohol/week: 0.0 standard drinks  . Drug use: No  . Sexual activity: Not Currently  Other Topics Concern  . Not on file  Social History Narrative   Daughter, son-in-law and grandson moved in with them 04/2018 to build a house but with COVID-19 their plans has been delayed    Social Determinants of Health   Financial Resource Strain: Low Risk   . Difficulty of Paying Living Expenses: Not hard at all  Food Insecurity: No Food Insecurity  . Worried About Charity fundraiser in the Last Year: Never true  . Ran Out of Food in the Last Year: Never true  Transportation Needs: No Transportation Needs  . Lack of Transportation (Medical): No  . Lack of Transportation (Non-Medical): No  Physical Activity: Insufficiently Active  . Days of Exercise per Week: 5 days  . Minutes of Exercise per  Session: 20 min  Stress: No Stress Concern Present  . Feeling of Stress : Only a little  Social Connections: Moderately Isolated  . Frequency of Communication with Friends and Family: More than three times a week  . Frequency of Social Gatherings with Friends and Family: Never  . Attends Religious Services: Never  . Active Member of Clubs or Organizations: No  . Attends Archivist Meetings: Never  . Marital Status: Married  Human resources officer Violence: Not At Risk  . Fear of Current or Ex-Partner: No  . Emotionally Abused: No  . Physically Abused: No  . Sexually Abused: No     Current Outpatient Medications:  .  Ergocalciferol (VITAMIN D2) 50 MCG (2000 UT) TABS, Take 1 tablet by mouth daily., Disp: 30 tablet, Rfl: 0 .  levothyroxine (SYNTHROID) 88 MCG tablet, TAKE 1 TABLET BY MOUTH  DAILY BEFORE BREAKFAST  SKIPS SUNDAY, Disp: 90 tablet, Rfl: 1 .  LORazepam (ATIVAN) 0.5 MG tablet, Take 1 tablet (0.5 mg total) by mouth 2 (two) times daily as needed for anxiety., Disp: 10 tablet, Rfl: 0 .  Methylcobalamin 1 MG CHEW, Chew 1 tablet by mouth daily., Disp: , Rfl:  .  rosuvastatin (CRESTOR) 10 MG tablet, Take 1 tablet (10 mg total) by mouth daily., Disp: 90 tablet, Rfl: 1  No Known Allergies   ROS  Constitutional: Negative for fever or weight change.  Respiratory: Negative for cough and shortness of breath.   Cardiovascular: Negative for chest pain or palpitations.  Gastrointestinal: Negative for abdominal pain, no bowel changes.  Musculoskeletal: Negative for gait problem or joint swelling.  Skin: Negative for rash.  Neurological: Negative for dizziness or headache.  No other specific complaints in a complete review of systems (except as listed in HPI above).  Objective  Vitals:   05/19/20 0754  BP: 138/82  Pulse: 100  Resp: 16  Temp: 97.8 F (36.6 C)  TempSrc: Oral  SpO2: 99%  Weight: 166 lb 12.8 oz (75.7 kg)  Height: 5' 2" (1.575 m)    Body mass index is  30.51  kg/m.  Physical Exam  Constitutional: Patient appears well-developed and obesity . No distress.  HENT: Head: Normocephalic and atraumatic. Ears: B TMs ok, no erythema or effusion; Nose: Nose normal. Mouth/Throat: Oropharynx is clear and moist. No oropharyngeal exudate.  Eyes: Conjunctivae and EOM are normal. Pupils are equal, round, and reactive to light. No scleral icterus.  Neck: Normal range of motion. Neck supple. No JVD present. No thyromegaly present.  Cardiovascular: Normal rate, regular rhythm and normal heart sounds.  No murmur heard. No BLE edema. Pulmonary/Chest: Effort normal and breath sounds normal. No respiratory distress. Abdominal: Soft. Bowel sounds are normal, no distension. There is no tenderness. no masses Breast: no lumps or masses, no nipple discharge or rashes FEMALE GENITALIA:  External genitalia normal External urethra normal Vaginal vault normal without discharge or lesions Cervix normal without discharge or lesions Bimanual exam normal without masses RECTAL: not done  Musculoskeletal: Normal range of motion, no joint effusions. No gross deformities Neurological: he is alert and oriented to person, place, and time. No cranial nerve deficit. Coordination, balance, strength, speech and gait are normal.  Skin: Skin is warm and dry. No rash noted. No erythema.  Psychiatric: Patient has a normal mood and affect. behavior is normal. Judgment and thought content normal.  Recent Results (from the past 2160 hour(s))  Lipid panel     Status: Abnormal   Collection Time: 05/17/20  8:38 AM  Result Value Ref Range   Cholesterol, Total 206 (H) 100 - 199 mg/dL   Triglycerides 131 0 - 149 mg/dL   HDL 63 >39 mg/dL   VLDL Cholesterol Cal 23 5 - 40 mg/dL   LDL Chol Calc (NIH) 120 (H) 0 - 99 mg/dL   Chol/HDL Ratio 3.3 0.0 - 4.4 ratio    Comment:                                   T. Chol/HDL Ratio                                             Men  Women                                1/2 Avg.Risk  3.4    3.3                                   Avg.Risk  5.0    4.4                                2X Avg.Risk  9.6    7.1                                3X Avg.Risk 23.4   11.0   Comprehensive metabolic panel     Status: Abnormal   Collection Time: 05/17/20  8:38 AM  Result Value Ref Range   Glucose 90 65 - 99 mg/dL   BUN 14 6 - 24 mg/dL   Creatinine, Ser 0.83 0.57 - 1.00 mg/dL   GFR  calc non Af Amer 78 >59 mL/min/1.73   GFR calc Af Amer 90 >59 mL/min/1.73    Comment: **In accordance with recommendations from the NKF-ASN Task force,**   [facility] is in the process of updating its eGFR calculation to the   2021 CKD-EPI creatinine equation that estimates kidney function   without a race variable.    BUN/Creatinine Ratio 17 9 - 23   Sodium 141 134 - 144 mmol/L   Potassium 4.3 3.5 - 5.2 mmol/L   Chloride 101 96 - 106 mmol/L   CO2 25 20 - 29 mmol/L   Calcium 9.8 8.7 - 10.2 mg/dL   Total Protein 6.7 6.0 - 8.5 g/dL   Albumin 4.7 3.8 - 4.9 g/dL   Globulin, Total 2.0 1.5 - 4.5 g/dL   Albumin/Globulin Ratio 2.4 (H) 1.2 - 2.2   Bilirubin Total 0.3 0.0 - 1.2 mg/dL   Alkaline Phosphatase 71 44 - 121 IU/L   AST 21 0 - 40 IU/L   ALT 15 0 - 32 IU/L  CBC with Differential/Platelet     Status: None   Collection Time: 05/17/20  8:38 AM  Result Value Ref Range   WBC 5.0 3.4 - 10.8 x10E3/uL   RBC 4.51 3.77 - 5.28 x10E6/uL   Hemoglobin 13.7 11.1 - 15.9 g/dL   Hematocrit 41.8 34.0 - 46.6 %   MCV 93 79 - 97 fL   MCH 30.4 26.6 - 33.0 pg   MCHC 32.8 31.5 - 35.7 g/dL   RDW 12.4 11.7 - 15.4 %   Platelets 213 150 - 450 x10E3/uL   Neutrophils 64 Not Estab. %   Lymphs 27 Not Estab. %   Monocytes 8 Not Estab. %   Eos 1 Not Estab. %   Basos 0 Not Estab. %   Neutrophils Absolute 3.1 1.4 - 7.0 x10E3/uL   Lymphocytes Absolute 1.4 0.7 - 3.1 x10E3/uL   Monocytes Absolute 0.4 0.1 - 0.9 x10E3/uL   EOS (ABSOLUTE) 0.1 0.0 - 0.4 x10E3/uL   Basophils Absolute 0.0 0.0 - 0.2 x10E3/uL    Immature Granulocytes 0 Not Estab. %   Immature Grans (Abs) 0.0 0.0 - 0.1 x10E3/uL  B12 and Folate Panel     Status: Abnormal   Collection Time: 05/17/20  8:38 AM  Result Value Ref Range   Vitamin B-12 1,447 (H) 232 - 1,245 pg/mL   Folate 14.3 >3.0 ng/mL    Comment: A serum folate concentration of less than 3.1 ng/mL is considered to represent clinical deficiency.   TSH     Status: None   Collection Time: 05/17/20  8:38 AM  Result Value Ref Range   TSH 2.320 0.450 - 4.500 uIU/mL  Hemoglobin A1c     Status: None   Collection Time: 05/17/20  8:38 AM  Result Value Ref Range   Hgb A1c MFr Bld 5.5 4.8 - 5.6 %    Comment:          Prediabetes: 5.7 - 6.4          Diabetes: >6.4          Glycemic control for adults with diabetes: <7.0    Est. average glucose Bld gHb Est-mCnc 111 mg/dL     Fall Risk: Fall Risk  05/19/2020 01/21/2020 10/20/2019 02/27/2019 01/27/2019  Falls in the past year? 1 1 0 0 0  Number falls in past yr: 0 0 0 0 0  Injury with Fall? _0 0 0  Risk for fall due to : -  History of fall(s) Impaired mobility;Impaired balance/gait - -     Functional Status Survey: Is the patient deaf or have difficulty hearing?: Yes Does the patient have difficulty seeing, even when wearing glasses/contacts?: No Does the patient have difficulty concentrating, remembering, or making decisions?: No Does the patient have difficulty walking or climbing stairs?: No Does the patient have difficulty dressing or bathing?: No Does the patient have difficulty doing errands alone such as visiting a doctor's office or shopping?: No   Assessment & Plan  1. Cervical cancer screening  - IGP,CTNG,RFXAPTHPV,RFX16/18,45  2. Need for shingles vaccine  - Varicella-zoster vaccine IM  3. Well adult exam  She will increase physical activity and improve her diet   4. Hypothyroidism, adult  Continue current dose of thyroid medication   5. Dyslipidemia  Change back to Crestor   6. Low serum  vitamin B12  Decrease supplementation   7. Vitamin D deficiency  Continue supplementation   8. Conductive hearing loss, bilateral  Stable  9. Insomnia, unspecified type   10. Osteopenia after menopause  Discussed weight bearing activities and high calcium diet   -USPSTF grade A and B recommendations reviewed with patient; age-appropriate recommendations, preventive care, screening tests, etc discussed and encouraged; healthy living encouraged; see AVS for patient education given to patient -Discussed importance of 150 minutes of physical activity weekly, eat two servings of fish weekly, eat one serving of tree nuts ( cashews, pistachios, pecans, almonds.Marland Kitchen) every other day, eat 6 servings of fruit/vegetables daily and drink plenty of water and avoid sweet beverages.

## 2020-05-19 ENCOUNTER — Encounter: Payer: Self-pay | Admitting: Family Medicine

## 2020-05-19 ENCOUNTER — Ambulatory Visit (INDEPENDENT_AMBULATORY_CARE_PROVIDER_SITE_OTHER): Payer: Managed Care, Other (non HMO) | Admitting: Family Medicine

## 2020-05-19 ENCOUNTER — Other Ambulatory Visit: Payer: Self-pay

## 2020-05-19 VITALS — BP 138/82 | HR 100 | Temp 97.8°F | Resp 16 | Ht 62.0 in | Wt 166.8 lb

## 2020-05-19 DIAGNOSIS — E559 Vitamin D deficiency, unspecified: Secondary | ICD-10-CM

## 2020-05-19 DIAGNOSIS — E039 Hypothyroidism, unspecified: Secondary | ICD-10-CM

## 2020-05-19 DIAGNOSIS — M8580 Other specified disorders of bone density and structure, unspecified site: Secondary | ICD-10-CM

## 2020-05-19 DIAGNOSIS — Z0000 Encounter for general adult medical examination without abnormal findings: Secondary | ICD-10-CM

## 2020-05-19 DIAGNOSIS — Z124 Encounter for screening for malignant neoplasm of cervix: Secondary | ICD-10-CM | POA: Diagnosis not present

## 2020-05-19 DIAGNOSIS — H900 Conductive hearing loss, bilateral: Secondary | ICD-10-CM

## 2020-05-19 DIAGNOSIS — E785 Hyperlipidemia, unspecified: Secondary | ICD-10-CM | POA: Diagnosis not present

## 2020-05-19 DIAGNOSIS — Z23 Encounter for immunization: Secondary | ICD-10-CM

## 2020-05-19 DIAGNOSIS — Z780 Asymptomatic menopausal state: Secondary | ICD-10-CM

## 2020-05-19 DIAGNOSIS — G4700 Insomnia, unspecified: Secondary | ICD-10-CM

## 2020-05-19 DIAGNOSIS — E538 Deficiency of other specified B group vitamins: Secondary | ICD-10-CM

## 2020-05-19 MED ORDER — ROSUVASTATIN CALCIUM 10 MG PO TABS
10.0000 mg | ORAL_TABLET | Freq: Every day | ORAL | 1 refills | Status: DC
Start: 2020-05-19 — End: 2020-09-14

## 2020-05-21 LAB — IGP,CTNG,RFXAPTHPV,RFX16/18,45
Chlamydia, Nuc. Acid Amp: NEGATIVE
Gonococcus by Nucleic Acid Amp: NEGATIVE

## 2020-07-19 ENCOUNTER — Other Ambulatory Visit: Payer: Self-pay

## 2020-07-19 ENCOUNTER — Ambulatory Visit: Payer: Managed Care, Other (non HMO) | Admitting: Family Medicine

## 2020-07-19 ENCOUNTER — Ambulatory Visit (INDEPENDENT_AMBULATORY_CARE_PROVIDER_SITE_OTHER): Payer: Managed Care, Other (non HMO)

## 2020-07-19 DIAGNOSIS — Z23 Encounter for immunization: Secondary | ICD-10-CM | POA: Diagnosis not present

## 2020-09-14 ENCOUNTER — Other Ambulatory Visit: Payer: Self-pay | Admitting: Family Medicine

## 2020-09-14 NOTE — Telephone Encounter (Signed)
Requested Prescriptions  Pending Prescriptions Disp Refills  . rosuvastatin (CRESTOR) 10 MG tablet [Pharmacy Med Name: Rosuvastatin Calcium 10 MG Oral Tablet] 90 tablet 1    Sig: TAKE 1 TABLET BY MOUTH  DAILY     Cardiovascular:  Antilipid - Statins Failed - 09/14/2020  9:24 PM      Failed - Total Cholesterol in normal range and within 360 days    Cholesterol, Total  Date Value Ref Range Status  05/17/2020 206 (H) 100 - 199 mg/dL Final         Failed - LDL in normal range and within 360 days    LDL Chol Calc (NIH)  Date Value Ref Range Status  05/17/2020 120 (H) 0 - 99 mg/dL Final         Passed - HDL in normal range and within 360 days    HDL  Date Value Ref Range Status  05/17/2020 63 >39 mg/dL Final         Passed - Triglycerides in normal range and within 360 days    Triglycerides  Date Value Ref Range Status  05/17/2020 131 0 - 149 mg/dL Final         Passed - Patient is not pregnant      Passed - Valid encounter within last 12 months    Recent Outpatient Visits          3 months ago Hypothyroidism, adult   [facility] Steele Sizer, MD   7 months ago Dyslipidemia   [facility] Steele Sizer, MD   11 months ago Hypothyroidism, adult   [facility] Steele Sizer, MD   1 year ago Vitamin D deficiency   [facility] Steele Sizer, MD   1 year ago Well adult exam   [facility] Steele Sizer, MD      Future Appointments            In 2 months Ancil Boozer, Drue Stager, MD [facility], [facility]

## 2020-10-13 ENCOUNTER — Other Ambulatory Visit: Payer: Self-pay | Admitting: Family Medicine

## 2020-10-13 DIAGNOSIS — E039 Hypothyroidism, unspecified: Secondary | ICD-10-CM

## 2020-11-15 NOTE — Progress Notes (Deleted)
Name: Taylor Gilmore   MRN: [id number]    DOB: 06/04/1960   Date:11/15/2020       Progress Note  Subjective  Chief Complaint  Follow Up  HPI  Osteopenia: on previous bone density done 2019, she had a  fracture of 5th metatarsal bone left foot after a fall . Discussed high calcium diet, continue vitamin D supplementation. Discussed bone density and increase of FRAX score, she does not want medication    Dyslipidemia: she is now on Pravastatin and tolerating well but LDL went up, she is willing to try Crestor 10 mg again, she said it the past caused muscle ache   Low B12:  she is now taking supplements three times weekly , levels above goal, she will back down to twice a week supplementation, currently on 3 times a week    Vitamin D : taking otc supplementation and vitamin D is at goal, unchanged    Insomnia: she takes Lorazepam very seldom, she feels like it gives her a hang over, she still has medication at home  Hypothyroidism: she denies change in bowel movements, , palpitation,  dysphagia or hair loss . 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 Stable, but has not been back to ENT if a long time. Unchanged    Patient Active Problem List   Diagnosis Date Noted   Family history of malignant neoplasm of gastrointestinal tract    Polyp of ascending colon    Osteopenia after menopause 03/27/2018   Anterior knee pain 01/10/2015   Symptomatic  menopausal or female climacteric states 01/10/2015   Otosclerosis of both ears 01/10/2015   Allergic rhinitis 01/10/2015   Hypothyroidism, adult 01/04/2015   Dyslipidemia 01/04/2015   Overweight (BMI 25.0-29.9) 01/04/2015   Vitamin D deficiency 01/04/2015   B12 deficiency 01/04/2015   Left anterior knee pain 01/04/2015   Bilateral hearing loss 01/04/2015   Anxiety disorder 01/04/2015   Personal history of healed traumatic fracture 01/29/2013    Past Surgical History:  Procedure Laterality Date   COLONOSCOPY WITH PROPOFOL N/A 03/14/2019   Procedure: COLONOSCOPY WITH BIOPSY;  Surgeon: [name], MD;  Location: [place];  Service: Endoscopy;  Laterality: N/A;   EXTERNAL EAR SURGERY Right    6th grade   KNEE SURGERY Right    ORIF TOE FRACTURE Left 08/26/2019   Procedure: OPEN REDUCTION INTERNAL FIXATION (ORIF) METATARSAL (TOE) FRACTURE LEFT 5TH;  Surgeon: Caroline More, DPM;  Location: [place];  Service: Podiatry;  Laterality: Left;  CHOICE LOCAL BLOCK   POLYPECTOMY N/A 03/14/2019   Procedure: POLYPECTOMY;  Surgeon: [name], MD;  Location: [place];  Service: Endoscopy;  Laterality: N/A;    Family History  Problem Relation Age of Onset   Hypertension Mother    Alcohol abuse Father    Cancer Father        Colon   Breast cancer Neg  Hx     Social History   Tobacco Use   Smoking status: Former    Packs/day: 0.25    Years: 10.00    Pack years: 2.50    Types: Cigarettes    Start date: 04/25/1983    Quit date: 04/24/1993    Years since quitting: 27.5   Smokeless tobacco: Never  Substance Use Topics   Alcohol use: No    Alcohol/week: 0.0 standard drinks     Current Outpatient Medications:    levothyroxine (SYNTHROID) 88 MCG tablet, TAKE 1 TABLET BY MOUTH  DAILY BEFORE BREAKFAST  SKIPS SUNDAY, Disp: 78 tablet, Rfl: 1   Ergocalciferol (VITAMIN D2) 50 MCG (2000 UT) TABS, Take 1 tablet by mouth daily., Disp: 30 tablet, Rfl: 0   LORazepam  (ATIVAN) 0.5 MG tablet, Take 1 tablet (0.5 mg total) by mouth 2 (two) times daily as needed for anxiety., Disp: 10 tablet, Rfl: 0   Methylcobalamin 1 MG CHEW, Chew 1 tablet by mouth daily., Disp: , Rfl:    rosuvastatin (CRESTOR) 10 MG tablet, TAKE 1 TABLET BY MOUTH  DAILY, Disp: 90 tablet, Rfl: 1  No Known Allergies  I personally reviewed {Reviewed:[id number]} with the patient/caregiver today.   ROS  ***  Objective  There were no vitals filed for this visit.  There is no height or weight on file to calculate BMI.  Physical Exam ***  No results found for this or any previous visit (from the past 2160 hour(s)).  Diabetic Foot Exam: Diabetic Foot Exam - Simple   No data filed    ***  PHQ2/9: Depression screen [facility] 2/9 05/19/2020 01/21/2020 10/20/2019 02/27/2019 01/27/2019  Decreased Interest 0 0 0 0 0  Down, Depressed, Hopeless 0 0 0 0 0  PHQ - 2 Score 0 0 0 0 0  Altered sleeping 0 - 0 0 0  Tired, decreased energy 0 - 0 0 0  Change in appetite 0 - 0 0 0  Feeling bad or failure about yourself  0 - 0 0 0  Trouble concentrating 0 - 0 0 0  Moving slowly or fidgety/restless 0 - 0 0 0  Suicidal thoughts 0 - 0 0 0  PHQ-9 Score 0 - 0 0 0  Some recent data might be hidden    phq 9 is {gen pos [id number] ***  Fall Risk: Fall Risk  05/19/2020 01/21/2020 10/20/2019 02/27/2019 01/27/2019  Falls in the past year? 1 1 0 0 0  Number falls in past yr: 0 0 0 0 0  Injury with Fall? '1 1 1 '$ 0 0  Risk for fall due to : - History of fall(s) Impaired mobility;Impaired balance/gait - -   ***   Functional Status Survey:   ***   Assessment & Plan  *** There are no diagnoses linked to this encounter.

## 2020-11-16 ENCOUNTER — Ambulatory Visit: Payer: Managed Care, Other (non HMO) | Admitting: Family Medicine

## 2020-11-20 IMAGING — MG DIGITAL SCREENING BILATERAL MAMMOGRAM WITH TOMO AND CAD
8 series · 9 of 24 positions shown · non-contrast
Comparison: Previous exam(s).

CLINICAL DATA: Screening.

EXAM:
DIGITAL SCREENING BILATERAL MAMMOGRAM WITH TOMO AND CAD

[R MLO synth-2D]
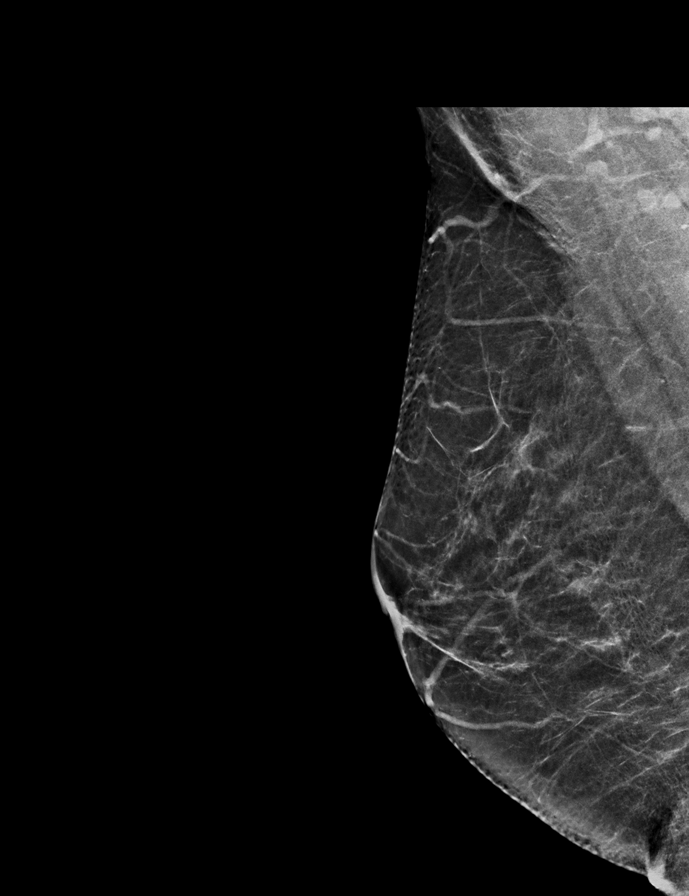

[L CC synth-2D]
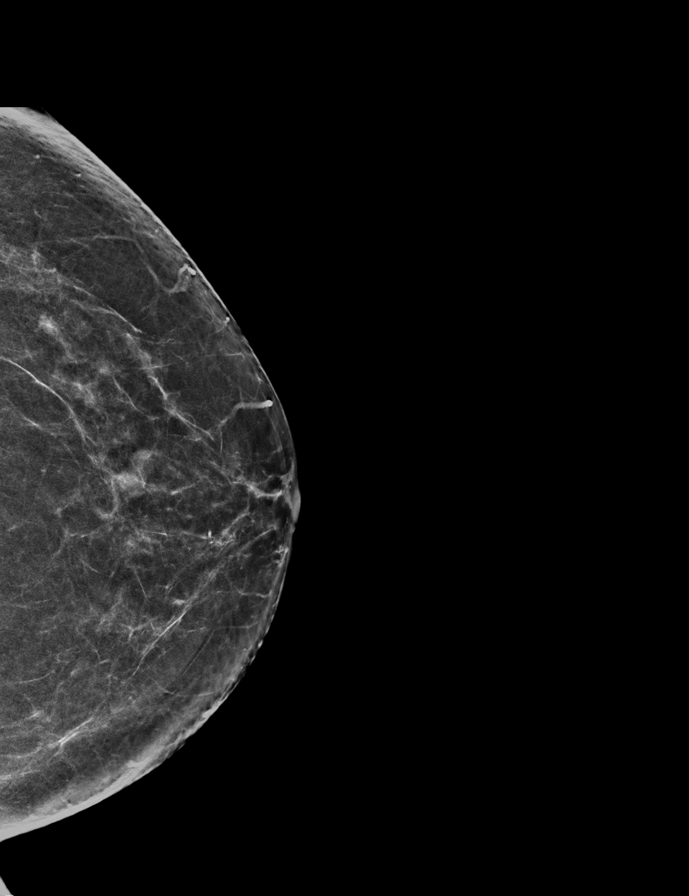

[R CC synth-2D]
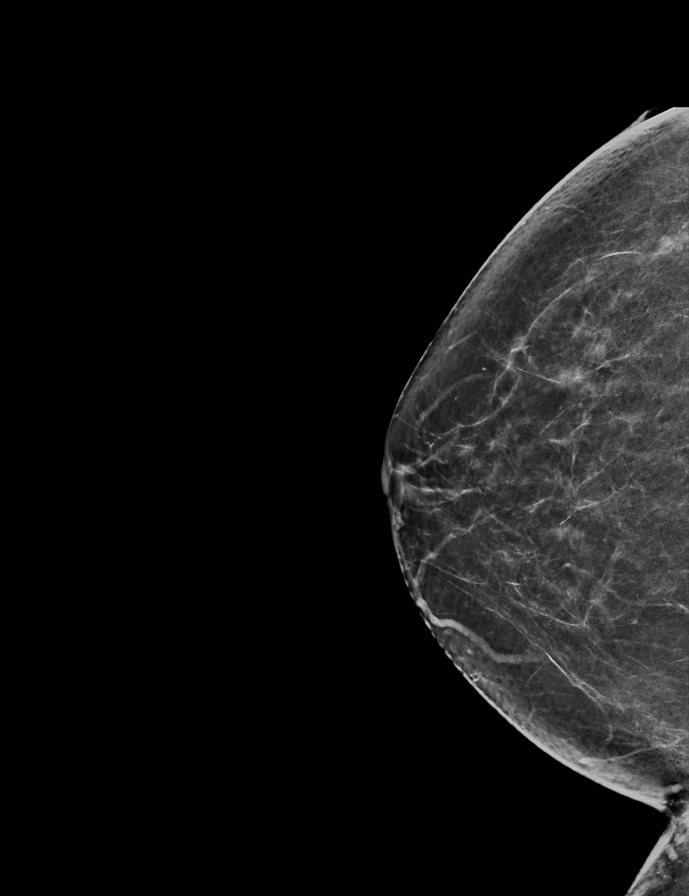

[L MLO synth-2D]
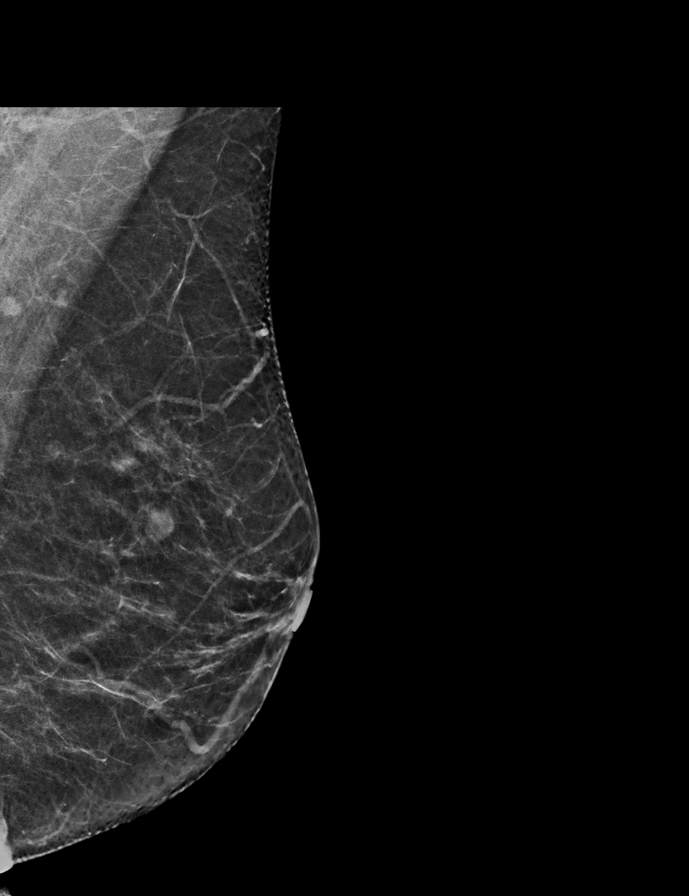

[R MLO tomo · 2 of 63 frames shown]
[frame 21/63]
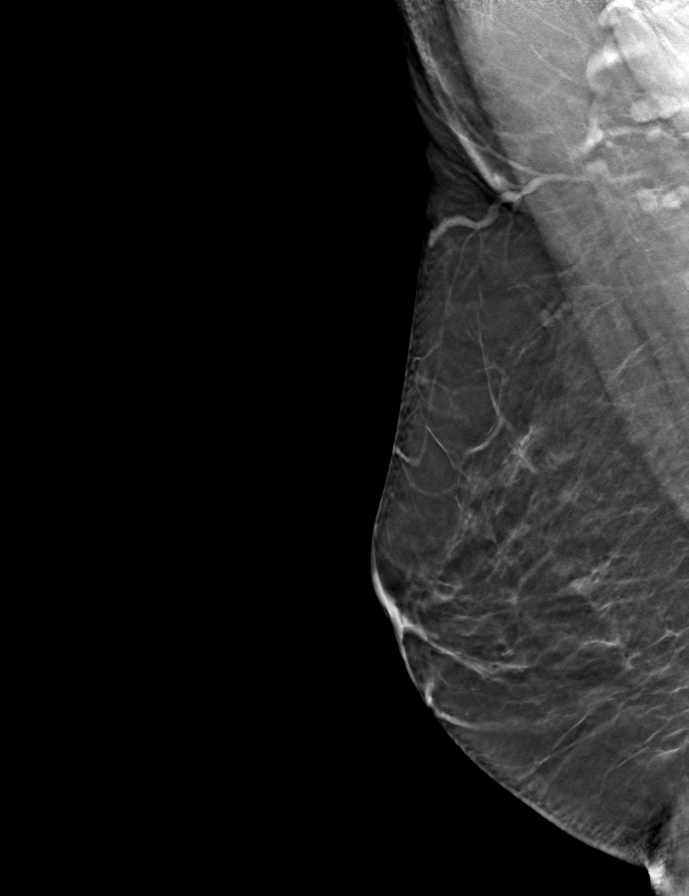
[frame 32/63]
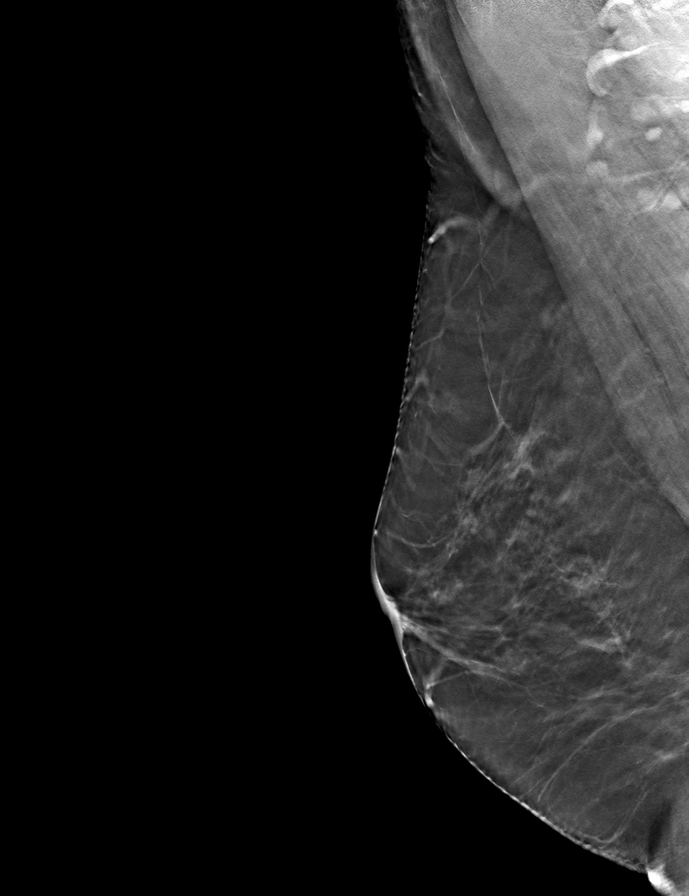

[L CC tomo · tomo slice 31/60.0]
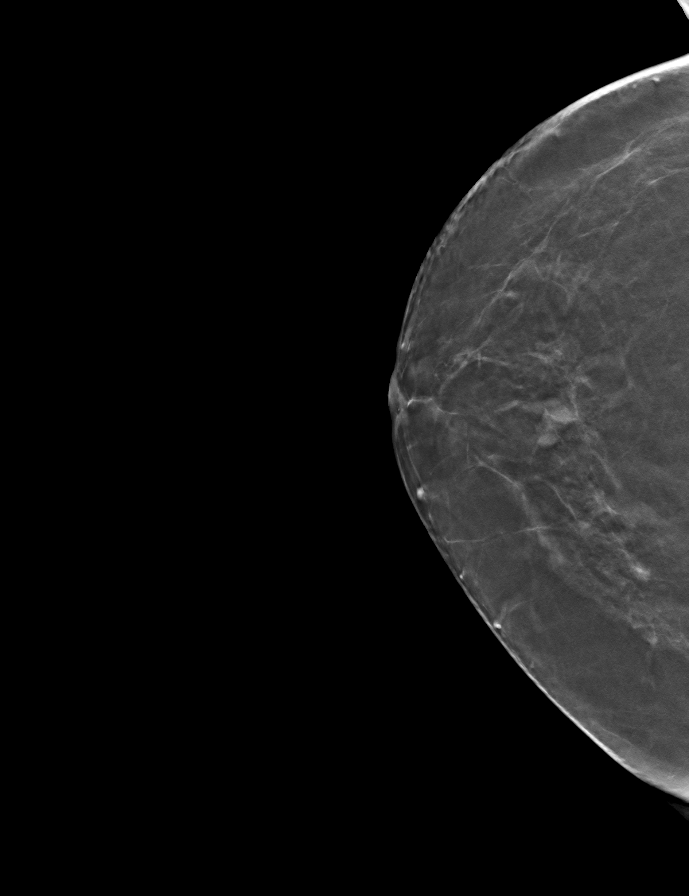

[L MLO tomo · tomo slice 29/58.0]
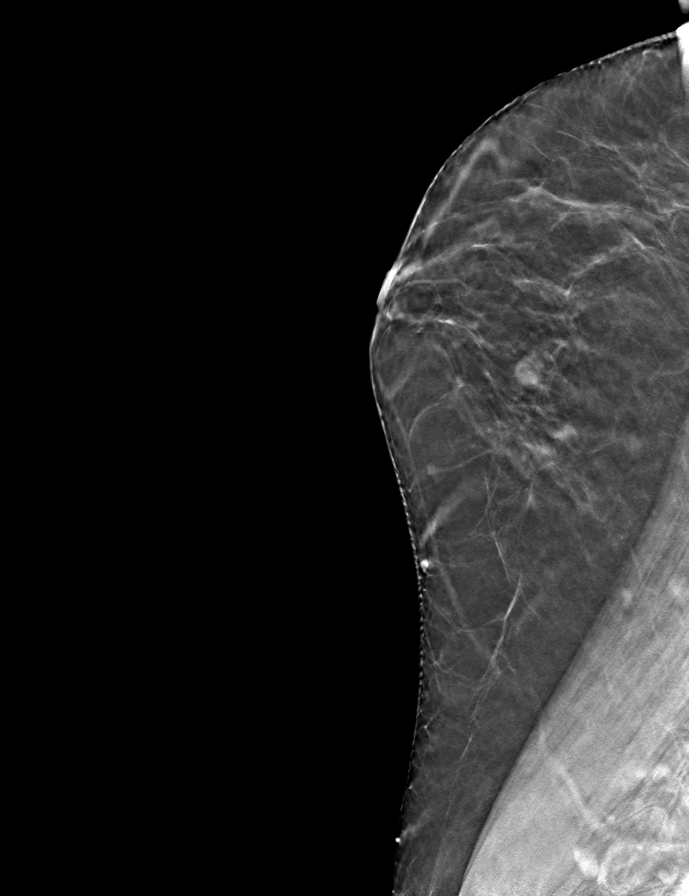

[R CC tomo · tomo slice 31/62.0]
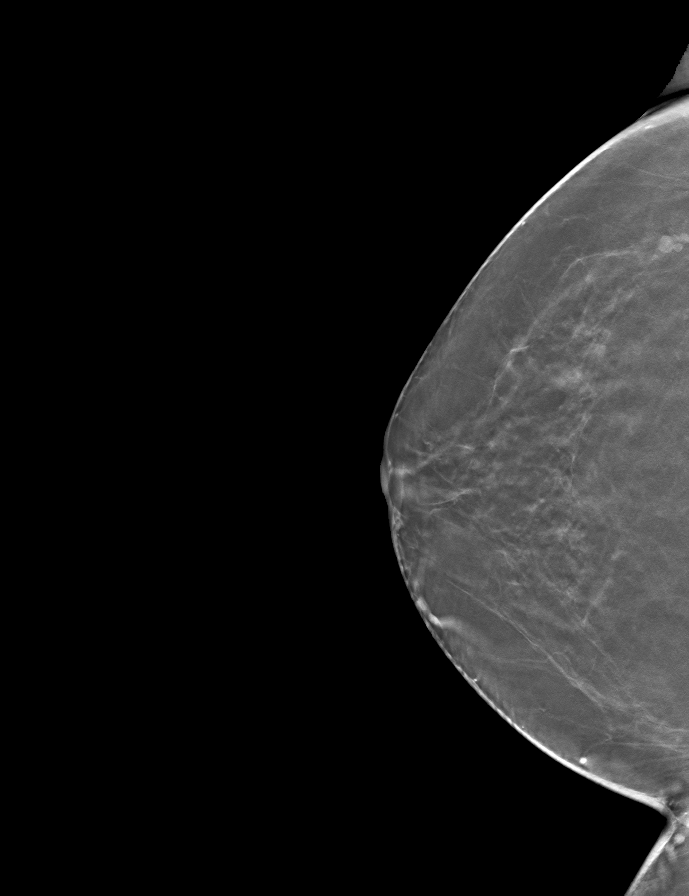

[9 of 24 positions shown; findings below may reference images not displayed]

ACR Breast Density Category b: There are scattered areas of
fibroglandular density.
FINDINGS: There are no findings suspicious for malignancy. Images were
processed with CAD.
IMPRESSION: No mammographic evidence of malignancy. A result letter of this
screening mammogram will be mailed directly to the patient.

RECOMMENDATION:
Screening mammogram in one year. (Code:[id number])

BI-RADS CATEGORY  1: Negative.

## 2021-02-28 ENCOUNTER — Other Ambulatory Visit: Payer: Self-pay | Admitting: Family Medicine

## 2021-03-01 NOTE — Telephone Encounter (Signed)
Requested Prescriptions  Pending Prescriptions Disp Refills  . rosuvastatin (CRESTOR) 10 MG tablet [Pharmacy Med Name: Rosuvastatin Calcium 10 MG Oral Tablet] 90 tablet 0    Sig: TAKE 1 TABLET BY MOUTH  DAILY     Cardiovascular:  Antilipid - Statins Failed - 02/28/2021 11:03 PM      Failed - Total Cholesterol in normal range and within 360 days    Cholesterol, Total  Date Value Ref Range Status  05/17/2020 206 (H) 100 - 199 mg/dL Final         Failed - LDL in normal range and within 360 days    LDL Chol Calc (NIH)  Date Value Ref Range Status  05/17/2020 120 (H) 0 - 99 mg/dL Final         Passed - HDL in normal range and within 360 days    HDL  Date Value Ref Range Status  05/17/2020 63 >39 mg/dL Final         Passed - Triglycerides in normal range and within 360 days    Triglycerides  Date Value Ref Range Status  05/17/2020 131 0 - 149 mg/dL Final         Passed - Patient is not pregnant      Passed - Valid encounter within last 12 months    Recent Outpatient Visits          9 months ago Hypothyroidism, adult   [facility] Steele Sizer, MD   1 year ago Dyslipidemia   [facility] Steele Sizer, MD   1 year ago Hypothyroidism, adult   [facility] Steele Sizer, MD   2 years ago Vitamin D deficiency   [facility] Steele Sizer, MD   2 years ago Well adult exam   [facility] Steele Sizer, MD

## 2021-03-15 ENCOUNTER — Other Ambulatory Visit: Payer: Self-pay | Admitting: Family Medicine

## 2021-03-15 DIAGNOSIS — E039 Hypothyroidism, unspecified: Secondary | ICD-10-CM

## 2021-03-15 NOTE — Telephone Encounter (Signed)
Requested Prescriptions  Pending Prescriptions Disp Refills  . levothyroxine (SYNTHROID) 88 MCG tablet [Pharmacy Med Name: Levothyroxine Sodium 88 MCG Oral Tablet] 78 tablet 0    Sig: TAKE 1 TABLET BY MOUTH  DAILY BEFORE BREAKFAST .  SKIP SUNDAY     Endocrinology:  Hypothyroid Agents Failed - 03/15/2021 10:24 PM      Failed - TSH needs to be rechecked within 3 months after an abnormal result. Refill until TSH is due.      Passed - TSH in normal range and within 360 days    TSH  Date Value Ref Range Status  05/17/2020 2.320 0.450 - 4.500 uIU/mL Final         Passed - Valid encounter within last 12 months    Recent Outpatient Visits          10  months ago Hypothyroidism, adult   [facility] Steele Sizer, MD   1 year ago Dyslipidemia   [facility] Steele Sizer, MD   1 year ago Hypothyroidism, adult   [facility] Steele Sizer, MD   2 years ago Vitamin D deficiency   [facility] Steele Sizer, MD   2 years ago Well adult exam   [facility] Steele Sizer, MD

## 2021-04-11 ENCOUNTER — Other Ambulatory Visit: Payer: Self-pay | Admitting: Family Medicine

## 2021-04-11 DIAGNOSIS — Z1231 Encounter for screening mammogram for malignant neoplasm of breast: Secondary | ICD-10-CM

## 2021-05-18 ENCOUNTER — Ambulatory Visit
Admission: RE | Admit: 2021-05-18 | Discharge: 2021-05-18 | Disposition: A | Discharge status: Home / Self Care | Payer: Managed Care, Other (non HMO) | Source: Ambulatory Visit | Attending: Family Medicine | Admitting: Family Medicine

## 2021-05-18 ENCOUNTER — Other Ambulatory Visit: Payer: Self-pay

## 2021-05-18 DIAGNOSIS — Z1231 Encounter for screening mammogram for malignant neoplasm of breast: Secondary | ICD-10-CM | POA: Diagnosis present

## 2021-05-23 ENCOUNTER — Other Ambulatory Visit: Payer: Self-pay | Admitting: Family Medicine

## 2021-05-23 DIAGNOSIS — E039 Hypothyroidism, unspecified: Secondary | ICD-10-CM

## 2021-05-24 ENCOUNTER — Other Ambulatory Visit: Payer: Self-pay

## 2021-05-24 NOTE — Progress Notes (Signed)
Name: Taylor Gilmore   MRN: [id number]    DOB: 12-31-1960   Date:05/25/2021       Progress Note  Subjective  Chief Complaint  Follow Up  HPI  Osteopenia: on previous bone density done 2019, she had a  fracture of 5th metatarsal bone left foot after a fall . Discussed high calcium diet, continue vitamin D supplementation. Discussed FRAX score and we will recheck it next year    Dyslipidemia: she is now on Pravastatin and tolerating well but LDL went up, she is now on Crestor and we will recheck labs    Low B12:  she is now taking supplements twice a week, she has been feeling tired, we will recheck labs    Vitamin D : taking otc supplementation and vitamin D is at goal, we will recheck labs    Insomnia: she takes Lorazepam very seldom, she feels like it gives her a hang over, she still has medication at home  Hypothyroidism: she denies change in bowel movements, , palpitation, dysphagia or hair loss . TSH was  suppressed for a long time, she is now on  88 mcg 6 days a week and we will recheck las. She has been feeling tired lately but not exercising, she will try to resume regular physical activity. Discussed online classes to do it at home    Obesity:history of obesity, highest weight was 180 lbs but she has been eating healthy, no longer at weight watchers she states since working from home, she is eating more frequent, she is trying to have healthy snacks, but not physically active and weight is gradually trending up. She will try resuming her [employer] visits a couple of times a week.    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 Stable, but has not been back to ENT if a long time. Stable    Patient Active Problem List   Diagnosis Date Noted   Family history of malignant neoplasm of gastrointestinal tract    Polyp of ascending colon    Osteopenia after menopause 03/27/2018   Anterior knee pain 01/10/2015   Symptomatic menopausal or female climacteric states 01/10/2015    Otosclerosis of both ears 01/10/2015   Allergic rhinitis 01/10/2015   Hypothyroidism, adult 01/04/2015   Dyslipidemia 01/04/2015   Overweight (BMI 25.0-29.9) 01/04/2015   Vitamin D deficiency 01/04/2015   B12 deficiency 01/04/2015   Left anterior knee pain 01/04/2015   Bilateral hearing loss 01/04/2015   Anxiety disorder 01/04/2015   Personal history of healed traumatic fracture 01/29/2013    Past Surgical History:  Procedure Laterality Date   COLONOSCOPY WITH PROPOFOL N/A 03/14/2019   Procedure: COLONOSCOPY WITH BIOPSY;  Surgeon: [name], MD;  Location: [place];  Service: Endoscopy;  Laterality: N/A;   EXTERNAL EAR SURGERY Right    6th grade   KNEE SURGERY Right    ORIF TOE FRACTURE Left 08/26/2019   Procedure: OPEN REDUCTION INTERNAL FIXATION (ORIF) METATARSAL (TOE) FRACTURE LEFT 5TH;  Surgeon: Caroline More, DPM;  Location: [place];  Service: Podiatry;  Laterality: Left;  CHOICE LOCAL BLOCK   POLYPECTOMY N/A 03/14/2019   Procedure: POLYPECTOMY;  Surgeon: [name], MD;  Location: [place];  Service: Endoscopy;  Laterality: N/A;    Family History  Problem Relation Age of Onset   Hypertension Mother    Alcohol abuse Father    Cancer Father        Colon   Breast cancer Cousin     Social History  Tobacco Use   Smoking status: Former    Packs/day: 0.25    Years: 10.00    Pack years: 2.50    Types: Cigarettes    Start date: 04/25/1983    Quit date: 04/24/1993    Years since quitting: 28.1   Smokeless tobacco: Never  Substance Use Topics   Alcohol use: No    Alcohol/week: 0.0 standard drinks     Current Outpatient Medications:    Ergocalciferol (VITAMIN D2) 50 MCG (2000 UT) TABS, Take 1 tablet by mouth daily., Disp: 30 tablet, Rfl: 0   levothyroxine (SYNTHROID) 88 MCG tablet, TAKE 1 TABLET BY MOUTH  DAILY BEFORE BREAKFAST .  SKIP SUNDAY, Disp: 78 tablet, Rfl: 0   LORazepam (ATIVAN) 0.5 MG tablet, Take 1 tablet (0.5 mg total)  by mouth 2 (two) times daily as needed for anxiety., Disp: 10 tablet, Rfl: 0   Methylcobalamin 1 MG CHEW, Chew 1 tablet by mouth daily., Disp: , Rfl:    rosuvastatin (CRESTOR) 10 MG tablet, TAKE 1 TABLET BY MOUTH  DAILY, Disp: 90 tablet, Rfl: 0  No Known Allergies  I personally reviewed active problem list, medication list, allergies, family history, social history, health maintenance with the patient/caregiver today.   ROS  Constitutional: Negative for fever or weight change.  Respiratory: Negative for cough and shortness of breath.   Cardiovascular: Negative for chest pain or palpitations.  Gastrointestinal: Negative for abdominal pain, no bowel changes.  Musculoskeletal: Negative for gait problem or joint swelling.  Skin: Negative for rash.  Neurological: Negative for dizziness or headache.  No other specific complaints in a complete review of systems (except as listed in HPI above).   Objective  Vitals:   05/25/21 0744  BP: 118/76  Pulse: 86  Resp: 16  SpO2: 99%  Weight: 169 lb (76.7 kg)  Height: 5\' 2"  (1.575 m)    Body mass index is 30.91 kg/m.  Physical Exam  Constitutional: Patient appears well-developed and well-nourished. Obese  No distress.  HEENT: head atraumatic, normocephalic, pupils equal and reactive to light, neck supple Cardiovascular: Normal rate, regular rhythm and normal heart sounds.  No murmur heard. No BLE edema. Pulmonary/Chest: Effort normal and breath sounds normal. No respiratory distress. Abdominal: Soft.  There is no tenderness. Psychiatric: Patient has a normal mood and affect. behavior is normal. Judgment and thought content normal.   PHQ2/9: Depression screen [facility] 2/9 05/25/2021 05/19/2020 01/21/2020 10/20/2019 02/27/2019  Decreased Interest 0 0 0 0 0  Down, Depressed, Hopeless 0 0 0 0 0  PHQ - 2 Score 0 0 0 0 0  Altered sleeping 0 0 - 0 0  Tired, decreased energy 0 0 - 0 0  Change in appetite 0 0 - 0 0  Feeling bad or failure about  yourself  0 0 - 0 0  Trouble concentrating 0 0 - 0 0  Moving slowly or fidgety/restless 0 0 - 0 0  Suicidal thoughts 0 0 - 0 0  PHQ-9 Score 0 0 - 0 0  Some recent data might be hidden    phq 9 is negative   Fall Risk: Fall Risk  05/25/2021 05/19/2020 01/21/2020 10/20/2019 02/27/2019  Falls in the past year? 0 1 1 0 0  Number falls in past yr: 0 0 0 0 0  Injury with Fall? 0 1 1 1  0  Risk for fall due to : No Fall Risks - History of fall(s) Impaired mobility;Impaired balance/gait -  Follow up Falls prevention discussed - - - -  Functional Status Survey: Is the patient deaf or have difficulty hearing?: Yes Does the patient have difficulty seeing, even when wearing glasses/contacts?: No Does the patient have difficulty concentrating, remembering, or making decisions?: No Does the patient have difficulty walking or climbing stairs?: No Does the patient have difficulty dressing or bathing?: No Does the patient have difficulty doing errands alone such as visiting a doctor's office or shopping?: No    Assessment & Plan  1. Hypothyroidism, adult  - TSH  2. Dyslipidemia  - Lipid panel - rosuvastatin (CRESTOR) 10 MG tablet; Take 1 tablet (10 mg total) by mouth daily.  Dispense: 90 tablet; Refill: 1  3. Low serum vitamin B12  - CBC with Differential/Platelet - Vitamin B12  4. Conductive hearing loss, bilateral   5. Vitamin D deficiency  - VITAMIN D 25 Hydroxy (Vit-D Deficiency, Fractures)  6. Osteopenia after menopause  - Comprehensive metabolic panel  7. Insomnia, unspecified type  - LORazepam (ATIVAN) 0.5 MG tablet; Take 1 tablet (0.5 mg total) by mouth at bedtime as needed for anxiety.  Dispense: 10 tablet; Refill: 0  8. Diabetes mellitus screening  - Hemoglobin A1c

## 2021-05-24 NOTE — Telephone Encounter (Signed)
Pt has an appt tomorrow °

## 2021-05-24 NOTE — Telephone Encounter (Signed)
Requested medications are due for refill today.  yes  Requested medications are on the active medications list.  yes  Last refill. Crestor 03/01/2021, Levothyroxine 03/15/2021  Future visit scheduled.   no  Notes to clinic.  Failed protocol d/t expired labs. Pt last seen 05/19/2020    Requested Prescriptions  Pending Prescriptions Disp Refills   rosuvastatin (CRESTOR) 10 MG tablet [Pharmacy Med Name: Rosuvastatin Calcium 10 MG Oral Tablet] 90 tablet 3    Sig: TAKE 1 TABLET BY MOUTH  DAILY     Cardiovascular:  Antilipid - Statins Failed - 05/23/2021 11:42 PM      Failed - Total Cholesterol in normal range and within 360 days    Cholesterol, Total  Date Value Ref Range Status  05/17/2020 206 (H) 100 - 199 mg/dL Final          Failed - LDL in normal range and within 360 days    LDL Chol Calc (NIH)  Date Value Ref Range Status  05/17/2020 120 (H) 0 - 99 mg/dL Final          Failed - HDL in normal range and within 360 days    HDL  Date Value Ref Range Status  05/17/2020 63 >39 mg/dL Final          Failed - Triglycerides in normal range and within 360 days    Triglycerides  Date Value Ref Range Status  05/17/2020 131 0 - 149 mg/dL Final          Failed - Valid encounter within last 12 months    Recent Outpatient Visits           1 year ago Hypothyroidism, adult   [facility] Steele Sizer, MD   1 year ago Dyslipidemia   [facility] Steele Sizer, MD   1 year ago Hypothyroidism, adult   [facility] Steele Sizer, MD   2 years ago Vitamin D deficiency   [facility] Steele Sizer, MD   2 years ago Well adult exam   [facility] Steele Sizer, MD              Passed - Patient is not pregnant       levothyroxine (SYNTHROID) 42 MCG tablet [Pharmacy Med Name: Levothyroxine Sodium 88 MCG Oral Tablet] 78 tablet 3    Sig: TAKE 1 TABLET BY MOUTH  DAILY BEFORE  BREAKFAST .  SKIP SUNDAY     Endocrinology:  Hypothyroid Agents Failed - 05/23/2021 11:42 PM      Failed - TSH needs to be rechecked within 3 months after an abnormal result. Refill until TSH is due.      Failed - TSH in normal range and within 360 days    TSH  Date Value Ref Range Status  05/17/2020 2.320 0.450 - 4.500 uIU/mL Final          Failed - Valid encounter within last 12 months    Recent Outpatient Visits           1 year ago Hypothyroidism, adult   [facility] Steele Sizer, MD   1 year ago Dyslipidemia   [facility] Steele Sizer, MD   1 year ago Hypothyroidism, adult   [facility] Steele Sizer, MD   2 years ago Vitamin D deficiency   [facility] Steele Sizer, MD   2 years ago Well adult exam   [facility] Steele Sizer, MD

## 2021-05-25 ENCOUNTER — Encounter: Payer: Self-pay | Admitting: Family Medicine

## 2021-05-25 ENCOUNTER — Ambulatory Visit: Payer: Managed Care, Other (non HMO) | Admitting: Family Medicine

## 2021-05-25 VITALS — BP 118/76 | HR 86 | Resp 16 | Ht 62.0 in | Wt 169.0 lb

## 2021-05-25 DIAGNOSIS — Z780 Asymptomatic menopausal state: Secondary | ICD-10-CM

## 2021-05-25 DIAGNOSIS — Z131 Encounter for screening for diabetes mellitus: Secondary | ICD-10-CM

## 2021-05-25 DIAGNOSIS — H900 Conductive hearing loss, bilateral: Secondary | ICD-10-CM

## 2021-05-25 DIAGNOSIS — E785 Hyperlipidemia, unspecified: Secondary | ICD-10-CM | POA: Diagnosis not present

## 2021-05-25 DIAGNOSIS — E538 Deficiency of other specified B group vitamins: Secondary | ICD-10-CM | POA: Diagnosis not present

## 2021-05-25 DIAGNOSIS — E039 Hypothyroidism, unspecified: Secondary | ICD-10-CM

## 2021-05-25 DIAGNOSIS — M8580 Other specified disorders of bone density and structure, unspecified site: Secondary | ICD-10-CM

## 2021-05-25 DIAGNOSIS — G4700 Insomnia, unspecified: Secondary | ICD-10-CM

## 2021-05-25 DIAGNOSIS — E559 Vitamin D deficiency, unspecified: Secondary | ICD-10-CM

## 2021-05-25 MED ORDER — LORAZEPAM 0.5 MG PO TABS: 0.5000 mg | ORAL_TABLET | Freq: Every evening | ORAL | 0 refills | Status: DC | PRN

## 2021-05-25 MED ORDER — ROSUVASTATIN CALCIUM 10 MG PO TABS: 10.0000 mg | ORAL_TABLET | Freq: Every day | ORAL | 1 refills | Status: DC

## 2021-06-07 LAB — COMPREHENSIVE METABOLIC PANEL
ALT: 15 IU/L (ref 0–32)
AST: 23 IU/L (ref 0–40)
Albumin/Globulin Ratio: 2.8 — ABNORMAL HIGH (ref 1.2–2.2)
Albumin: 4.7 g/dL (ref 3.8–4.9)
Alkaline Phosphatase: 57 IU/L (ref 44–121)
BUN/Creatinine Ratio: 19 (ref 9–23)
BUN: 15 mg/dL (ref 6–24)
Bilirubin Total: 0.4 mg/dL (ref 0.0–1.2)
CO2: 26 mmol/L (ref 20–29)
Calcium: 9.8 mg/dL (ref 8.7–10.2)
Chloride: 103 mmol/L (ref 96–106)
Creatinine, Ser: 0.79 mg/dL (ref 0.57–1.00)
Globulin, Total: 1.7 g/dL (ref 1.5–4.5)
Glucose: 95 mg/dL (ref 70–99)
Potassium: 4.5 mmol/L (ref 3.5–5.2)
Sodium: 140 mmol/L (ref 134–144)
Total Protein: 6.4 g/dL (ref 6.0–8.5)
eGFR: 86 mL/min/{1.73_m2} (ref >59)

## 2021-06-07 LAB — CBC WITH DIFFERENTIAL/PLATELET
Basophils Absolute: 0 10*3/uL (ref 0.0–0.2)
Basos: 1 %
EOS (ABSOLUTE): 0.1 10*3/uL (ref 0.0–0.4)
Eos: 3 %
Hematocrit: 39.5 % (ref 34.0–46.6)
Hemoglobin: 13.1 g/dL (ref 11.1–15.9)
Immature Grans (Abs): 0 10*3/uL (ref 0.0–0.1)
Immature Granulocytes: 0 %
Lymphocytes Absolute: 1.1 10*3/uL (ref 0.7–3.1)
Lymphs: 30 %
MCH: 30.8 pg (ref 26.6–33.0)
MCHC: 33.2 g/dL (ref 31.5–35.7)
MCV: 93 fL (ref 79–97)
Monocytes Absolute: 0.3 10*3/uL (ref 0.1–0.9)
Monocytes: 8 %
Neutrophils Absolute: 2.1 10*3/uL (ref 1.4–7.0)
Neutrophils: 58 %
Platelets: 192 10*3/uL (ref 150–450)
RBC: 4.26 x10E6/uL (ref 3.77–5.28)
RDW: 12.3 % (ref 11.7–15.4)
WBC: 3.6 10*3/uL (ref 3.4–10.8)

## 2021-06-07 LAB — LIPID PANEL
Chol/HDL Ratio: 2.3 ratio (ref 0.0–4.4)
Cholesterol, Total: 162 mg/dL (ref 100–199)
HDL: 69 mg/dL (ref >39)
LDL Chol Calc (NIH): 76 mg/dL (ref 0–99)
Triglycerides: 94 mg/dL (ref 0–149)
VLDL Cholesterol Cal: 17 mg/dL (ref 5–40)

## 2021-06-07 LAB — TSH: TSH: 1.26 u[IU]/mL (ref 0.450–4.500)

## 2021-06-07 LAB — HEMOGLOBIN A1C
Est. average glucose Bld gHb Est-mCnc: 114 mg/dL
Hgb A1c MFr Bld: 5.6 % (ref 4.8–5.6)

## 2021-06-07 LAB — VITAMIN B12: Vitamin B-12: 1058 pg/mL (ref 232–1245)

## 2021-06-07 LAB — VITAMIN D 25 HYDROXY (VIT D DEFICIENCY, FRACTURES): Vit D, 25-Hydroxy: 45.2 ng/mL (ref 30.0–100.0)

## 2021-09-01 ENCOUNTER — Other Ambulatory Visit: Payer: Self-pay | Admitting: Family Medicine

## 2021-09-01 DIAGNOSIS — E039 Hypothyroidism, unspecified: Secondary | ICD-10-CM

## 2021-09-01 MED ORDER — LEVOTHYROXINE SODIUM 88 MCG PO TABS: ORAL_TABLET | ORAL | 0 refills | Status: DC

## 2021-09-01 NOTE — Telephone Encounter (Signed)
Short term Rx requested ?Requested Prescriptions  ?Pending Prescriptions Disp Refills  ?? levothyroxine (SYNTHROID) 88 MCG tablet 78 tablet 0  ?  Sig: TAKE 1 TABLET BY MOUTH  DAILY BEFORE BREAKFAST .  SKIP SUNDAY  ?  ? Endocrinology:  Hypothyroid Agents Passed - 09/01/2021 12:22 PM  ?  ?  Passed - TSH in normal range and within 360 days  ?  TSH  ?Date Value Ref Range Status  ?06/06/2021 1.260 0.450 - 4.500 uIU/mL Final  ?   ?  ?  Passed - Valid encounter within last 12 months  ?  Recent Outpatient Visits   ?      ? 3 months ago Hypothyroidism, adult  ? [facility] Steele Sizer, MD  ? 1 year ago Hypothyroidism, adult  ? [facility] Steele Sizer, MD  ? 1 year ago Dyslipidemia  ? [facility] Steele Sizer, MD  ? 1 year ago Hypothyroidism, adult  ? [facility] Steele Sizer, MD  ? 2 years ago Vitamin D deficiency  ? [facility] Steele Sizer, MD  ?  ?  ?Future Appointments   ?        ? In 2 months Steele Sizer, MD [facility] [place]  ? In 8 months Steele Sizer, MD [facility] [place]  ?  ? ?  ?  ?  ? ?

## 2021-09-01 NOTE — Telephone Encounter (Signed)
Medication Refill - Medication: levothyroxine (SYNTHROID) 88 MCG tablet  ? ?Has the patient contacted their pharmacy? No. ? ?(Agent: If yes, when and what did the pharmacy advise?) need 30 Rx sent  ? ?Preferred Pharmacy (with phone number or street name):  ?[id number]:  [phone] ?Fax:  [phone] ?  ? ?Has the patient been seen for an appointment in the last year OR does the patient have an upcoming appointment? Yes.   ? ?Agent: Please be advised that RX refills may take up to 3 business days. We ask that you follow-up with your pharmacy. ? ?

## 2021-09-01 NOTE — Telephone Encounter (Signed)
Medication Refill - Medication: levothyroxine (SYNTHROID) 88 MCG tablet ? ?Has the patient contacted their pharmacy? No. ? ?(Agent: If yes, when and what did the pharmacy advise?)need new Rx  ? ?Preferred Pharmacy (with phone number or street name):  ?[facility] ([place], [place] Phone:  [phone]  ?Fax:  [phone]  ?  ? ?Has the patient been seen for an appointment in the last year OR does the patient have an upcoming appointment? Yes.   ? ?Agent: Please be advised that RX refills may take up to 3 business days. We ask that you follow-up with your pharmacy. ? ?

## 2021-09-01 NOTE — Telephone Encounter (Signed)
Requested Prescriptions  ?Pending Prescriptions Disp Refills  ?? levothyroxine (SYNTHROID) 88 MCG tablet 78 tablet 0  ?  Sig: TAKE 1 TABLET BY MOUTH  DAILY BEFORE BREAKFAST .  SKIP SUNDAY  ?  ? Endocrinology:  Hypothyroid Agents Passed - 09/01/2021 12:19 PM  ?  ?  Passed - TSH in normal range and within 360 days  ?  TSH  ?Date Value Ref Range Status  ?06/06/2021 1.260 0.450 - 4.500 uIU/mL Final  ?   ?  ?  Passed - Valid encounter within last 12 months  ?  Recent Outpatient Visits   ?      ? 3 months ago Hypothyroidism, adult  ? [facility] Steele Sizer, MD  ? 1 year ago Hypothyroidism, adult  ? [facility] Steele Sizer, MD  ? 1 year ago Dyslipidemia  ? [facility] Steele Sizer, MD  ? 1 year ago Hypothyroidism, adult  ? [facility] Steele Sizer, MD  ? 2 years ago Vitamin D deficiency  ? [facility] Steele Sizer, MD  ?  ?  ?Future Appointments   ?        ? In 2 months Steele Sizer, MD [facility] [place]  ? In 8 months Steele Sizer, MD [facility] [place]  ?  ? ?  ?  ?  ? ?

## 2021-10-28 ENCOUNTER — Other Ambulatory Visit: Payer: Self-pay | Admitting: Family Medicine

## 2021-10-28 DIAGNOSIS — E039 Hypothyroidism, unspecified: Secondary | ICD-10-CM

## 2021-11-10 ENCOUNTER — Other Ambulatory Visit: Payer: Self-pay | Admitting: Family Medicine

## 2021-11-10 DIAGNOSIS — E785 Hyperlipidemia, unspecified: Secondary | ICD-10-CM

## 2021-11-24 ENCOUNTER — Encounter: Payer: Managed Care, Other (non HMO) | Admitting: Family Medicine

## 2021-11-30 NOTE — Progress Notes (Unsigned)
Name: Taylor Gilmore   MRN: [id number]    DOB: 05/13/1960   Date:11/30/2021       Progress Note  Subjective  Chief Complaint  Annual Exam  HPI  Patient presents for annual CPE.  Diet: *** Exercise: ***   Flowsheet Row Office Visit from 01/21/2020 in [facility]  AUDIT-C Score 0      Depression: Phq 9 is  {Desc; negative/positive:13464}    05/25/2021    7:44 AM 05/19/2020    7:52 AM 01/21/2020    8:56 AM 10/20/2019    9:19 AM 02/27/2019    8:20 AM  Depression screen PHQ 2/9  Decreased Interest 0 0 0 0 0  Down, Depressed, Hopeless 0 0 0 0 0  PHQ - 2 Score 0 0 0 0 0  Altered sleeping 0 0  0 0  Tired, decreased energy 0 0  0 0  Change in appetite 0 0  0 0  Feeling bad or failure about yourself  0 0  0 0  Trouble concentrating 0 0  0 0  Moving slowly or fidgety/restless 0 0  0 0  Suicidal thoughts 0 0  0 0  PHQ-9 Score 0 0  0 0   Hypertension: BP Readings from Last 3 Encounters:  05/25/21 118/76  05/19/20 138/82  01/21/20 132/86   Obesity: Wt Readings from Last 3 Encounters:  05/25/21 169 lb (76.7 kg)  05/19/20 166 lb 12.8 oz (75.7 kg)  01/21/20 165 lb 9.6 oz (75.1 kg)   BMI Readings from Last 3 Encounters:  05/25/21 30.91 kg/m  05/19/20 30.51 kg/m  01/21/20 30.29 kg/m     Vaccines:   HPV: N/A Tdap: up to date Shingrix: up to date Pneumonia: N/A Flu: 2021 COVID-19: up to date   Hep C Screening: 09/17/12 STD testing and prevention (HIV/chl/gon/syphilis): 01/11/15 Intimate partner violence: negative screen  Sexual History : Menstrual History/LMP/Abnormal Bleeding:  Discussed importance of follow up if any post-menopausal bleeding: {Response; yes/no/na:63}  Incontinence Symptoms: negative for symptoms   Breast cancer:  - Last Mammogram: 05/18/21 - BRCA gene screening: N/A  Osteoporosis Prevention : Discussed high calcium and vitamin D supplementation, weight bearing exercises Bone density: 05/17/20  Cervical cancer screening:  05/19/20  Skin cancer: Discussed monitoring for atypical lesions  Colorectal cancer: 03/14/19   Lung cancer:  Low Dose CT Chest recommended if Age 57-80 years, 20 pack-year currently smoking OR have quit w/in 15years. Patient does not qualify for screen   ECG: 01/16/17  Advanced Care Planning: A voluntary discussion about advance care planning including the explanation and discussion of advance directives.  Discussed health care proxy and Living will, and the patient was able to identify a health care proxy as ***.  Patient does not have a living will and power of attorney of health care   Lipids: Lab Results  Component Value Date   CHOL 162 06/06/2021   CHOL 206 (H) 05/17/2020   CHOL 178 02/03/2019   Lab Results  Component Value Date   HDL 69 06/06/2021   HDL 63 05/17/2020   HDL 61 02/03/2019   Lab Results  Component Value Date   LDLCALC 76 06/06/2021   LDLCALC 120 (H) 05/17/2020   LDLCALC 95 02/03/2019   Lab Results  Component Value Date   TRIG 94 06/06/2021   TRIG 131 05/17/2020   TRIG 127 02/03/2019   Lab Results  Component Value Date   CHOLHDL 2.3 06/06/2021   CHOLHDL 3.3 05/17/2020  CHOLHDL 2.9 02/03/2019   No results found for: "LDLDIRECT"  Glucose: Glucose  Date Value Ref Range Status  06/06/2021 95 70 - 99 mg/dL Final  05/17/2020 90 65 - 99 mg/dL Final  02/03/2019 94 65 - 99 mg/dL Final    Patient Active Problem List   Diagnosis Date Noted   Family history of malignant neoplasm of gastrointestinal tract    Polyp of ascending colon    Osteopenia after menopause 03/27/2018   Anterior knee pain 01/10/2015   Symptomatic menopausal or female climacteric states 01/10/2015   Otosclerosis of both ears 01/10/2015   Allergic rhinitis 01/10/2015   Hypothyroidism, adult 01/04/2015   Dyslipidemia 01/04/2015   Overweight (BMI 25.0-29.9) 01/04/2015   Vitamin D deficiency 01/04/2015   B12 deficiency 01/04/2015   Left anterior knee pain 01/04/2015   Bilateral  hearing loss 01/04/2015   Anxiety disorder 01/04/2015   Personal history of healed traumatic fracture 01/29/2013    Past Surgical History:  Procedure Laterality Date   COLONOSCOPY WITH PROPOFOL N/A 03/14/2019   Procedure: COLONOSCOPY WITH BIOPSY;  Surgeon: [name], MD;  Location: [place];  Service: Endoscopy;  Laterality: N/A;   EXTERNAL EAR SURGERY Right    6th grade   KNEE SURGERY Right    ORIF TOE FRACTURE Left 08/26/2019   Procedure: OPEN REDUCTION INTERNAL FIXATION (ORIF) METATARSAL (TOE) FRACTURE LEFT 5TH;  Surgeon: Caroline More, DPM;  Location: [place];  Service: Podiatry;  Laterality: Left;  CHOICE LOCAL BLOCK   POLYPECTOMY N/A 03/14/2019   Procedure: POLYPECTOMY;  Surgeon: [name], MD;  Location: [place];  Service: Endoscopy;  Laterality: N/A;    Family History  Problem Relation Age of Onset   Hypertension Mother    Alcohol abuse Father    Cancer Father        Colon   Breast cancer Cousin     Social History   Socioeconomic History   Marital status: Married    Spouse name: Laveda Abbe    Number of children: 3   Years of education: Not on file   Highest education level: High school graduate  Occupational History   Occupation: Press photographer: [employer]  Tobacco Use   Smoking status: Former    Packs/day: 0.25    Years: 10.00    Total pack years: 2.50    Types: Cigarettes    Start date: 04/25/1983    Quit date: 04/24/1993    Years since quitting: 28.6   Smokeless tobacco: Never  Vaping Use   Vaping Use: Never used  Substance and Sexual Activity   Alcohol use: No    Alcohol/week: 0.0 standard drinks of alcohol   Drug use: No   Sexual activity: Not Currently  Other Topics Concern   Not on file  Social History Narrative   Daughter, son-in-law and grandson moved in with them 04/2018 to build a house but with COVID-19 their plans has been delayed    Social Determinants of Health   Financial Resource Strain: Low Risk   (05/19/2020)   Overall Financial Resource Strain (CARDIA)    Difficulty of Paying Living Expenses: Not hard at all  Food Insecurity: No Food Insecurity (05/19/2020)   Hunger Vital Sign    Worried About Running Out of Food in the Last Year: Never true    [place] in the Last Year: Never true  Transportation Needs: No Transportation Needs (05/19/2020)   PRAPARE - Transportation    Lack of  Transportation (Medical): No    Lack of Transportation (Non-Medical): No  Physical Activity: Insufficiently Active (05/19/2020)   Exercise Vital Sign    Days of Exercise per Week: 5 days    Minutes of Exercise per Session: 20 min  Stress: No Stress Concern Present (05/19/2020)   [place]    Feeling of Stress : Only a little  Social Connections: Moderately Isolated (05/19/2020)   Social Connection and Isolation Panel [NHANES]    Frequency of Communication with Friends and Family: More than three times a week    Frequency of Social Gatherings with Friends and Family: Never    Attends Religious Services: Never    Marine scientist or Organizations: No    Attends Archivist Meetings: Never    Marital Status: Married  Human resources officer Violence: Not At Risk (05/19/2020)   Humiliation, Afraid, Rape, and Kick questionnaire    Fear of Current or Ex-Partner: No    Emotionally Abused: No    Physically Abused: No    Sexually Abused: No     Current Outpatient Medications:    Ergocalciferol (VITAMIN D2) 50 MCG (2000 UT) TABS, Take 1 tablet by mouth daily., Disp: 30 tablet, Rfl: 0   levothyroxine (SYNTHROID) 88 MCG tablet, TAKE 1 TABLET BY MOUTH DAILY  BEFORE BREAKFAST . SKIP SUNDAY, Disp: 78 tablet, Rfl: 0   LORazepam (ATIVAN) 0.5 MG tablet, Take 1 tablet (0.5 mg total) by mouth at bedtime as needed for anxiety., Disp: 10 tablet, Rfl: 0   Methylcobalamin 1 MG CHEW, Chew 1 tablet by mouth daily., Disp: , Rfl:    rosuvastatin  (CRESTOR) 10 MG tablet, TAKE 1 TABLET BY MOUTH DAILY, Disp: 90 tablet, Rfl: 3  No Known Allergies   ROS  ***  Objective  There were no vitals filed for this visit.  There is no height or weight on file to calculate BMI.  Physical Exam ***  No results found for this or any previous visit (from the past 2160 hour(s)).   Fall Risk:    05/25/2021    7:43 AM 05/19/2020    7:52 AM 01/21/2020    8:55 AM 10/20/2019    9:18 AM 02/27/2019    8:19 AM  Fall Risk   Falls in the past year? 0 1 1 0 0  Number falls in past yr: 0 0 0 0 0  Injury with Fall? 0 '1 1 1 ' 0  Risk for fall due to : No Fall Risks  History of fall(s) Impaired mobility;Impaired balance/gait   Follow up Falls prevention discussed         Functional Status Survey:     Assessment & Plan  1. Well adult exam ***   -USPSTF grade A and B recommendations reviewed with patient; age-appropriate recommendations, preventive care, screening tests, etc discussed and encouraged; healthy living encouraged; see AVS for patient education given to patient -Discussed importance of 150 minutes of physical activity weekly, eat two servings of fish weekly, eat one serving of tree nuts ( cashews, pistachios, pecans, almonds.Marland Kitchen) every other day, eat 6 servings of fruit/vegetables daily and drink plenty of water and avoid sweet beverages.   -Reviewed Health Maintenance: Yes.

## 2021-11-30 NOTE — Patient Instructions (Signed)
Preventive Care 60-60 Years Old, Female Preventive care refers to lifestyle choices and visits with your health care provider that can promote health and wellness. Preventive care visits are also called wellness exams. What can I expect for my preventive care visit? Counseling Your health care provider may ask you questions about your: Medical history, including: Past medical problems. Family medical history. Pregnancy history. Current health, including: Menstrual cycle. Method of birth control. Emotional well-being. Home life and relationship well-being. Sexual activity and sexual health. Lifestyle, including: Alcohol, nicotine or tobacco, and drug use. Access to firearms. Diet, exercise, and sleep habits. Work and work environment. Sunscreen use. Safety issues such as seatbelt and bike helmet use. Physical exam Your health care provider will check your: Height and weight. These may be used to calculate your BMI (body mass index). BMI is a measurement that tells if you are at a healthy weight. Waist circumference. This measures the distance around your waistline. This measurement also tells if you are at a healthy weight and may help predict your risk of certain diseases, such as type 2 diabetes and high blood pressure. Heart rate and blood pressure. Body temperature. Skin for abnormal spots. What immunizations do I need?  Vaccines are usually given at various ages, according to a schedule. Your health care provider will recommend vaccines for you based on your age, medical history, and lifestyle or other factors, such as travel or where you work. What tests do I need? Screening Your health care provider may recommend screening tests for certain conditions. This may include: Lipid and cholesterol levels. Diabetes screening. This is done by checking your blood sugar (glucose) after you have not eaten for a while (fasting). Pelvic exam and Pap test. Hepatitis B test. Hepatitis C  test. HIV (human immunodeficiency virus) test. STI (sexually transmitted infection) testing, if you are at risk. Lung cancer screening. Colorectal cancer screening. Mammogram. Talk with your health care provider about when you should start having regular mammograms. This may depend on whether you have a family history of breast cancer. BRCA-related cancer screening. This may be done if you have a family history of breast, ovarian, tubal, or peritoneal cancers. Bone density scan. This is done to screen for osteoporosis. Talk with your health care provider about your test results, treatment options, and if necessary, the need for more tests. Follow these instructions at home: Eating and drinking  Eat a diet that includes fresh fruits and vegetables, whole grains, lean protein, and low-fat dairy products. Take vitamin and mineral supplements as recommended by your health care provider. Do not drink alcohol if: Your health care provider tells you not to drink. You are pregnant, may be pregnant, or are planning to become pregnant. If you drink alcohol: Limit how much you have to 0-1 drink a day. Know how much alcohol is in your drink. In the [place] one drink equals one 12 oz bottle of beer (355 mL), one 5 oz glass of wine (148 mL), or one 1 oz glass of hard liquor (44 mL). Lifestyle Brush your teeth every morning and night with fluoride toothpaste. Floss one time each day. Exercise for at least 30 minutes 5 or more days each week. Do not use any products that contain nicotine or tobacco. These products include cigarettes, chewing tobacco, and vaping devices, such as e-cigarettes. If you need help quitting, ask your health care provider. Do not use drugs. If you are sexually active, practice safe sex. Use a condom or other form of protection to   prevent STIs. If you do not wish to become pregnant, use a form of birth control. If you plan to become pregnant, see your health care provider for a  prepregnancy visit. Take aspirin only as told by your health care provider. Make sure that you understand how much to take and what form to take. Work with your health care provider to find out whether it is safe and beneficial for you to take aspirin daily. Find healthy ways to manage stress, such as: Meditation, yoga, or listening to music. Journaling. Talking to a trusted person. Spending time with friends and family. Minimize exposure to UV radiation to reduce your risk of skin cancer. Safety Always wear your seat belt while driving or riding in a vehicle. Do not drive: If you have been drinking alcohol. Do not ride with someone who has been drinking. When you are tired or distracted. While texting. If you have been using any mind-altering substances or drugs. Wear a helmet and other protective equipment during sports activities. If you have firearms in your house, make sure you follow all gun safety procedures. Seek help if you have been physically or sexually abused. What's next? Visit your health care provider once a year for an annual wellness visit. Ask your health care provider how often you should have your eyes and teeth checked. Stay up to date on all vaccines. This information is not intended to replace advice given to you by your health care provider. Make sure you discuss any questions you have with your health care provider. Document Revised: 10/06/2020 Document Reviewed: 10/06/2020 Elsevier Patient Education  [place].

## 2021-12-01 ENCOUNTER — Encounter: Payer: Self-pay | Admitting: Family Medicine

## 2021-12-01 ENCOUNTER — Ambulatory Visit (INDEPENDENT_AMBULATORY_CARE_PROVIDER_SITE_OTHER): Payer: Managed Care, Other (non HMO) | Admitting: Family Medicine

## 2021-12-01 VITALS — BP 122/78 | HR 91 | Resp 16 | Ht 62.0 in | Wt 162.0 lb

## 2021-12-01 DIAGNOSIS — Z0000 Encounter for general adult medical examination without abnormal findings: Secondary | ICD-10-CM | POA: Diagnosis not present

## 2021-12-13 ENCOUNTER — Other Ambulatory Visit: Payer: Self-pay | Admitting: Family Medicine

## 2021-12-13 DIAGNOSIS — E039 Hypothyroidism, unspecified: Secondary | ICD-10-CM

## 2021-12-13 LAB — TSH: TSH: 1.19 u[IU]/mL (ref 0.450–4.500)

## 2021-12-13 MED ORDER — LEVOTHYROXINE SODIUM 88 MCG PO TABS: ORAL_TABLET | ORAL | 1 refills | Status: DC

## 2022-05-24 ENCOUNTER — Other Ambulatory Visit: Payer: Self-pay | Admitting: Family Medicine

## 2022-05-24 DIAGNOSIS — Z1231 Encounter for screening mammogram for malignant neoplasm of breast: Secondary | ICD-10-CM

## 2022-05-24 NOTE — Progress Notes (Unsigned)
Name: Taylor Gilmore   MRN: [id number]    DOB: 09/04/1960   Date:05/25/2022       Progress Note  Subjective  Chief Complaint  Follow Up  HPI  Osteopenia: on previous bone density done 2019, she had a  fracture of 5th metatarsal bone left foot after a fall . Last Bone density in 2022 and advised to repeat it now    Dyslipidemia:  she is now on Crestor, last LDL had improved, we will recheck labs today. She does not eat red meat. No side effects    Low B12:  she is now taking supplements twice a week, we will recheck labs    Vitamin D : taking otc supplementation and vitamin D is at goal, we will recheck labs today    Insomnia: she takes Lorazepam very seldom, due for a refill, gets 10 for one year   Hypothyroidism: she denies change in bowel movements, fatigue , palpitation, dysphagia or hair loss . TSH has been at goal on   88 mcg 6 days a week and we will recheck las.    History of obesity, highest weight was 180 lbs but she has been eating healthy. She followed Weight Watchers for a while and used to run, but now works from home, eating smaller portions, trying to walk around the house. Weight is down 5 lbs in the past 6 months.    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 Stable, but has not been back to ENT if a long time. She only wears hearing aid to watch TV  Patient Active Problem List   Diagnosis Date Noted   Family history of malignant neoplasm of gastrointestinal tract    Polyp of ascending colon    Osteopenia after menopause 03/27/2018   Anterior knee pain 01/10/2015   Symptomatic menopausal or female climacteric states 01/10/2015   Otosclerosis of both ears 01/10/2015   Allergic rhinitis 01/10/2015   Hypothyroidism, adult 01/04/2015   Dyslipidemia 01/04/2015   Overweight (BMI 25.0-29.9) 01/04/2015   Vitamin D deficiency 01/04/2015   B12 deficiency 01/04/2015   Left anterior knee pain 01/04/2015   Bilateral hearing loss 01/04/2015   Anxiety  disorder 01/04/2015   Personal history of healed traumatic fracture 01/29/2013    Past Surgical History:  Procedure Laterality Date   COLONOSCOPY WITH PROPOFOL N/A 03/14/2019   Procedure: COLONOSCOPY WITH BIOPSY;  Surgeon: [name], MD;  Location: [place];  Service: Endoscopy;  Laterality: N/A;   EXTERNAL EAR SURGERY Right    6th grade   KNEE SURGERY Right    ORIF TOE FRACTURE Left 08/26/2019   Procedure: OPEN REDUCTION INTERNAL FIXATION (ORIF) METATARSAL (TOE) FRACTURE LEFT 5TH;  Surgeon: Caroline More, DPM;  Location: [place];  Service: Podiatry;  Laterality: Left;  CHOICE LOCAL BLOCK   POLYPECTOMY N/A 03/14/2019   Procedure: POLYPECTOMY;  Surgeon: [name], MD;  Location: [place];  Service: Endoscopy;  Laterality: N/A;    Family History  Problem Relation Age of Onset   Hypertension Mother    Alcohol abuse Father    Cancer Father        Colon   Breast cancer Cousin     Social History   Tobacco Use   Smoking status: Former    Packs/day: 0.25    Years: 10.00    Total pack years: 2.50    Types: Cigarettes    Start date: 04/25/1983    Quit date: 04/24/1993    Years since quitting:  29.1   Smokeless tobacco: Never  Substance Use Topics   Alcohol use: No    Alcohol/week: 0.0 standard drinks of alcohol     Current Outpatient Medications:    Ergocalciferol (VITAMIN D2) 50 MCG (2000 UT) TABS, Take 1 tablet by mouth daily., Disp: 30 tablet, Rfl: 0   levothyroxine (SYNTHROID) 88 MCG tablet, One daily except for Sundays ( skip ), Disp: 78 tablet, Rfl: 1   LORazepam (ATIVAN) 0.5 MG tablet, Take 1 tablet (0.5 mg total) by mouth at bedtime as needed for anxiety., Disp: 10 tablet, Rfl: 0   Methylcobalamin 1 MG CHEW, Chew 1 tablet by mouth daily., Disp: , Rfl:    rosuvastatin (CRESTOR) 10 MG tablet, TAKE 1 TABLET BY MOUTH DAILY, Disp: 90 tablet, Rfl: 3  No Known Allergies  I personally reviewed active problem list, medication list, allergies,  family history, social history, health maintenance with the patient/caregiver today.   ROS  Ten systems reviewed and is negative except as mentioned in HPI   Objective  Vitals:   05/25/22 0824  BP: 124/72  Pulse: 91  Resp: 16  SpO2: 98%  Weight: 157 lb (71.2 kg)  Height: '5\' 2"'$  (1.575 m)    Body mass index is 28.72 kg/m.  Physical Exam  Constitutional: Patient appears well-developed and well-nourished.  No distress.  HEENT: head atraumatic, normocephalic, pupils equal and reactive to light, neck supple Cardiovascular: Normal rate, regular rhythm and normal heart sounds.  No murmur heard. No BLE edema. Pulmonary/Chest: Effort normal and breath sounds normal. No respiratory distress. Abdominal: Soft.  There is no tenderness. Psychiatric: Patient has a normal mood and affect. behavior is normal. Judgment and thought content normal.    PHQ2/9:    05/25/2022    8:24 AM 12/01/2021    8:57 AM 05/25/2021    7:44 AM 05/19/2020    7:52 AM 01/21/2020    8:56 AM  Depression screen PHQ 2/9  Decreased Interest 0 0 0 0 0  Down, Depressed, Hopeless 0 0 0 0 0  PHQ - 2 Score 0 0 0 0 0  Altered sleeping 0 0 0 0   Tired, decreased energy 0 0 0 0   Change in appetite 0 0 0 0   Feeling bad or failure about yourself  0 0 0 0   Trouble concentrating 0 0 0 0   Moving slowly or fidgety/restless 0 0 0 0   Suicidal thoughts 0 0 0 0   PHQ-9 Score 0 0 0 0     phq 9 is negative   Fall Risk:    05/25/2022    8:24 AM 12/01/2021    8:57 AM 05/25/2021    7:43 AM 05/19/2020    7:52 AM 01/21/2020    8:55 AM  Fall Risk   Falls in the past year? 0 0 0 1 1  Number falls in past yr: 0 0 0 0 0  Injury with Fall? 0 0 0 1 1  Risk for fall due to : No Fall Risks No Fall Risks No Fall Risks  History of fall(s)  Follow up Falls prevention discussed Falls prevention discussed Falls prevention discussed        Functional Status Survey: Is the patient deaf or have difficulty hearing?: Yes Does the patient  have difficulty seeing, even when wearing glasses/contacts?: No Does the patient have difficulty concentrating, remembering, or making decisions?: No Does the patient have difficulty walking or climbing stairs?: No Does the patient have difficulty  dressing or bathing?: No Does the patient have difficulty doing errands alone such as visiting a doctor's office or shopping?: No    Assessment & Plan  1. Hypothyroidism, adult  - TSH - levothyroxine (SYNTHROID) 88 MCG tablet; One daily except for Sundays ( skip )  Dispense: 78 tablet; Refill: 1  2. Low serum vitamin B12  - CBC with Differential/Platelet - B12 and Folate Panel  3. Dyslipidemia  - Lipid panel  4. Vitamin D deficiency  - VITAMIN D 25 Hydroxy (Vit-D Deficiency, Fractures)  5. Diabetes mellitus screening  - Hemoglobin A1c  6. Osteopenia after menopause  Bone density   7. Insomnia, unspecified type  - LORazepam (ATIVAN) 0.5 MG tablet; Take 1 tablet (0.5 mg total) by mouth at bedtime as needed for anxiety.  Dispense: 10 tablet; Refill: 0  8. Conductive hearing loss, bilateral  She wears her hearing aid only when watching TV  9. Long-term use of high-risk medication  - Comprehensive metabolic panel

## 2022-05-25 ENCOUNTER — Ambulatory Visit: Payer: Managed Care, Other (non HMO) | Admitting: Family Medicine

## 2022-05-25 ENCOUNTER — Encounter: Payer: Self-pay | Admitting: Family Medicine

## 2022-05-25 VITALS — BP 124/72 | HR 91 | Resp 16 | Ht 62.0 in | Wt 157.0 lb

## 2022-05-25 DIAGNOSIS — Z131 Encounter for screening for diabetes mellitus: Secondary | ICD-10-CM

## 2022-05-25 DIAGNOSIS — E538 Deficiency of other specified B group vitamins: Secondary | ICD-10-CM | POA: Diagnosis not present

## 2022-05-25 DIAGNOSIS — Z780 Asymptomatic menopausal state: Secondary | ICD-10-CM

## 2022-05-25 DIAGNOSIS — Z79899 Other long term (current) drug therapy: Secondary | ICD-10-CM

## 2022-05-25 DIAGNOSIS — E559 Vitamin D deficiency, unspecified: Secondary | ICD-10-CM | POA: Diagnosis not present

## 2022-05-25 DIAGNOSIS — E785 Hyperlipidemia, unspecified: Secondary | ICD-10-CM

## 2022-05-25 DIAGNOSIS — H900 Conductive hearing loss, bilateral: Secondary | ICD-10-CM

## 2022-05-25 DIAGNOSIS — E039 Hypothyroidism, unspecified: Secondary | ICD-10-CM

## 2022-05-25 DIAGNOSIS — M8580 Other specified disorders of bone density and structure, unspecified site: Secondary | ICD-10-CM

## 2022-05-25 DIAGNOSIS — G4700 Insomnia, unspecified: Secondary | ICD-10-CM

## 2022-05-25 MED ORDER — LEVOTHYROXINE SODIUM 88 MCG PO TABS: ORAL_TABLET | ORAL | 1 refills | Status: DC

## 2022-05-25 MED ORDER — LORAZEPAM 0.5 MG PO TABS: 0.5000 mg | ORAL_TABLET | Freq: Every evening | ORAL | 0 refills | Status: DC | PRN

## 2022-06-23 LAB — LIPID PANEL
Chol/HDL Ratio: 2.2 ratio (ref 0.0–4.4)
Cholesterol, Total: 169 mg/dL (ref 100–199)
HDL: 76 mg/dL (ref >39)
LDL Chol Calc (NIH): 77 mg/dL (ref 0–99)
Triglycerides: 88 mg/dL (ref 0–149)
VLDL Cholesterol Cal: 16 mg/dL (ref 5–40)

## 2022-06-23 LAB — CBC WITH DIFFERENTIAL/PLATELET
Basophils Absolute: 0 10*3/uL (ref 0.0–0.2)
Basos: 1 %
EOS (ABSOLUTE): 0.1 10*3/uL (ref 0.0–0.4)
Eos: 3 %
Hematocrit: 40.7 % (ref 34.0–46.6)
Hemoglobin: 13.6 g/dL (ref 11.1–15.9)
Immature Grans (Abs): 0 10*3/uL (ref 0.0–0.1)
Immature Granulocytes: 0 %
Lymphocytes Absolute: 1.1 10*3/uL (ref 0.7–3.1)
Lymphs: 28 %
MCH: 31.1 pg (ref 26.6–33.0)
MCHC: 33.4 g/dL (ref 31.5–35.7)
MCV: 93 fL (ref 79–97)
Monocytes Absolute: 0.3 10*3/uL (ref 0.1–0.9)
Monocytes: 7 %
Neutrophils Absolute: 2.3 10*3/uL (ref 1.4–7.0)
Neutrophils: 61 %
Platelets: 191 10*3/uL (ref 150–450)
RBC: 4.38 x10E6/uL (ref 3.77–5.28)
RDW: 12.3 % (ref 11.7–15.4)
WBC: 3.8 10*3/uL (ref 3.4–10.8)

## 2022-06-23 LAB — COMPREHENSIVE METABOLIC PANEL
ALT: 18 IU/L (ref 0–32)
AST: 19 IU/L (ref 0–40)
Albumin/Globulin Ratio: 2.9 — ABNORMAL HIGH (ref 1.2–2.2)
Albumin: 4.9 g/dL (ref 3.8–4.9)
Alkaline Phosphatase: 60 IU/L (ref 44–121)
BUN/Creatinine Ratio: 13 (ref 12–28)
BUN: 10 mg/dL (ref 8–27)
Bilirubin Total: 0.3 mg/dL (ref 0.0–1.2)
CO2: 24 mmol/L (ref 20–29)
Calcium: 9.9 mg/dL (ref 8.7–10.3)
Chloride: 105 mmol/L (ref 96–106)
Creatinine, Ser: 0.76 mg/dL (ref 0.57–1.00)
Globulin, Total: 1.7 g/dL (ref 1.5–4.5)
Glucose: 91 mg/dL (ref 70–99)
Potassium: 5.1 mmol/L (ref 3.5–5.2)
Sodium: 142 mmol/L (ref 134–144)
Total Protein: 6.6 g/dL (ref 6.0–8.5)
eGFR: 90 mL/min/{1.73_m2} (ref >59)

## 2022-06-23 LAB — HEMOGLOBIN A1C
Est. average glucose Bld gHb Est-mCnc: 120 mg/dL
Hgb A1c MFr Bld: 5.8 % — ABNORMAL HIGH (ref 4.8–5.6)

## 2022-06-23 LAB — PARATHYROID HORMONE, INTACT (NO CA): PTH: 33 pg/mL (ref 15–65)

## 2022-06-23 LAB — B12 AND FOLATE PANEL
Folate: 12.3 ng/mL (ref >3.0)
Vitamin B-12: >2000 pg/mL — ABNORMAL HIGH (ref 232–1245)

## 2022-06-23 LAB — TSH: TSH: 1.69 u[IU]/mL (ref 0.450–4.500)

## 2022-06-23 LAB — VITAMIN D 25 HYDROXY (VIT D DEFICIENCY, FRACTURES): Vit D, 25-Hydroxy: 44.2 ng/mL (ref 30.0–100.0)

## 2022-07-24 ENCOUNTER — Ambulatory Visit
Admission: RE | Admit: 2022-07-24 | Discharge: 2022-07-24 | Disposition: A | Discharge status: Home / Self Care | Payer: Managed Care, Other (non HMO) | Source: Ambulatory Visit | Attending: Family Medicine | Admitting: Family Medicine

## 2022-07-24 DIAGNOSIS — Z1231 Encounter for screening mammogram for malignant neoplasm of breast: Secondary | ICD-10-CM

## 2022-07-24 DIAGNOSIS — Z780 Asymptomatic menopausal state: Secondary | ICD-10-CM | POA: Diagnosis present

## 2022-07-24 DIAGNOSIS — M8580 Other specified disorders of bone density and structure, unspecified site: Secondary | ICD-10-CM | POA: Diagnosis present

## 2022-10-12 ENCOUNTER — Other Ambulatory Visit: Payer: Self-pay | Admitting: Family Medicine

## 2022-10-12 DIAGNOSIS — E785 Hyperlipidemia, unspecified: Secondary | ICD-10-CM

## 2022-10-13 ENCOUNTER — Other Ambulatory Visit: Payer: Self-pay

## 2022-10-13 DIAGNOSIS — E785 Hyperlipidemia, unspecified: Secondary | ICD-10-CM

## 2022-10-16 MED ORDER — ROSUVASTATIN CALCIUM 10 MG PO TABS
10.0000 mg | ORAL_TABLET | Freq: Every day | ORAL | 3 refills | Status: AC
Start: 2022-10-16 — End: ?

## 2022-10-25 ENCOUNTER — Other Ambulatory Visit: Payer: Self-pay | Admitting: Family Medicine

## 2022-10-25 DIAGNOSIS — E039 Hypothyroidism, unspecified: Secondary | ICD-10-CM

## 2022-12-07 ENCOUNTER — Encounter: Payer: Managed Care, Other (non HMO) | Admitting: Family Medicine

## 2023-03-13 NOTE — Progress Notes (Unsigned)
Name: Taylor Gilmore   MRN: [id number]    DOB: 28-Jul-1960   Date:03/14/2023       Progress Note  Subjective  Chief Complaint  Annual Exam  HPI  Patient presents for annual CPE.  Diet: trying to cut down on carbohydrates  Exercise:  discussed 150 minutes per week  Last Eye Exam: up to date Last Dental Exam: up to date   [employer] Visit from 03/14/2023 in [facility]  AUDIT-C Score 0      Depression: Phq 9 is  negative    03/14/2023    7:41 AM 05/25/2022    8:24 AM 12/01/2021    8:57 AM 05/25/2021    7:44 AM 05/19/2020    7:52 AM  Depression screen PHQ 2/9  Decreased Interest 0 0 0 0 0  Down, Depressed, Hopeless 0 0 0 0 0  PHQ - 2 Score 0 0 0 0 0  Altered sleeping 0 0 0 0 0  Tired, decreased energy 0 0 0 0 0  Change in appetite 0 0 0 0 0  Feeling bad or failure about yourself  0 0 0 0 0  Trouble concentrating 0 0 0 0 0  Moving slowly or fidgety/restless 0 0 0 0 0  Suicidal thoughts 0 0 0 0 0  PHQ-9 Score 0 0 0 0 0   Hypertension: BP Readings from Last 3 Encounters:  03/14/23 132/74  05/25/22 124/72  12/01/21 122/78   Obesity: Wt Readings from Last 3 Encounters:  03/14/23 142 lb (64.4 kg)  05/25/22 157 lb (71.2 kg)  12/01/21 162 lb (73.5 kg)   BMI Readings from Last 3 Encounters:  03/14/23 25.15 kg/m  05/25/22 28.72 kg/m  12/01/21 29.63 kg/m     Vaccines:   RSV: discussed with patient  Tdap: up to date Shingrix: up to date Pneumonia: N/A Flu: today  COVID-19: up to date   Hep C Screening: 06/29/22 STD testing and prevention (HIV/chl/gon/syphilis): 01/11/15 Intimate partner violence: negative screen  Sexual History :  not sexually active in a while  Menstrual History/LMP/Abnormal Bleeding: post menopausal  Discussed importance of follow up if any post-menopausal bleeding: yes  Incontinence Symptoms: negative for symptoms   Breast cancer:  - Last Mammogram: 07/24/22 - BRCA gene screening:  N/A  Osteoporosis Prevention : Discussed high calcium and vitamin D supplementation, weight bearing exercises Bone density: 07/24/22   Cervical cancer screening: 05/19/20  Skin cancer: Discussed monitoring for atypical lesions  Colorectal cancer: 03/14/19   Lung cancer:  Low Dose CT Chest recommended if Age 62-80 years, 20 pack-year currently smoking OR have quit w/in 15years. Patient does not qualify for screen   ECG: 01/16/17  Advanced Care Planning: A voluntary discussion about advance care planning including the explanation and discussion of advance directives.  Discussed health care proxy and Living will, and the patient was able to identify a health care proxy as husband .  Patient does not have a living will and power of attorney of health care   Lipids: Lab Results  Component Value Date   CHOL 169 06/22/2022   CHOL 162 06/06/2021   CHOL 206 (H) 05/17/2020   Lab Results  Component Value Date   HDL 76 06/22/2022   HDL 69 06/06/2021   HDL 63 05/17/2020   Lab Results  Component Value Date   LDLCALC 77 06/22/2022   LDLCALC 76 06/06/2021   LDLCALC 120 (H) 05/17/2020   Lab Results  Component Value Date  TRIG 88 06/22/2022   TRIG 94 06/06/2021   TRIG 131 05/17/2020   Lab Results  Component Value Date   CHOLHDL 2.2 06/22/2022   CHOLHDL 2.3 06/06/2021   CHOLHDL 3.3 05/17/2020   No results found for: "LDLDIRECT"  Glucose: Glucose  Date Value Ref Range Status  06/22/2022 91 70 - 99 mg/dL Final  16/01/9603 95 70 - 99 mg/dL Final  54/12/8117 90 65 - 99 mg/dL Final    Patient Active Problem List   Diagnosis Date Noted   Family history of malignant neoplasm of gastrointestinal tract    Polyp of ascending colon    Osteopenia after menopause 03/27/2018   Anterior knee pain 01/10/2015   Symptomatic menopausal or female climacteric states 01/10/2015   Otosclerosis of both ears 01/10/2015   Allergic rhinitis 01/10/2015   Hypothyroidism, adult 01/04/2015   Dyslipidemia  01/04/2015   Overweight (BMI 25.0-29.9) 01/04/2015   Vitamin D deficiency 01/04/2015   B12 deficiency 01/04/2015   Left anterior knee pain 01/04/2015   Bilateral hearing loss 01/04/2015   Anxiety disorder 01/04/2015   Personal history of healed traumatic fracture 01/29/2013    Past Surgical History:  Procedure Laterality Date   COLONOSCOPY WITH PROPOFOL N/A 03/14/2019   Procedure: COLONOSCOPY WITH BIOPSY;  Surgeon: Midge Minium, MD;  Location: [facility];  Service: Endoscopy;  Laterality: N/A;   EXTERNAL EAR SURGERY Right    6th grade   KNEE SURGERY Right    ORIF TOE FRACTURE Left 08/26/2019   Procedure: OPEN REDUCTION INTERNAL FIXATION (ORIF) METATARSAL (TOE) FRACTURE LEFT 5TH;  Surgeon: Rosetta Posner, DPM;  Location: [facility];  Service: Podiatry;  Laterality: Left;  CHOICE LOCAL BLOCK   POLYPECTOMY N/A 03/14/2019   Procedure: POLYPECTOMY;  Surgeon: Midge Minium, MD;  Location: [facility];  Service: Endoscopy;  Laterality: N/A;    Family History  Problem Relation Age of Onset   Hypertension Mother    Alcohol abuse Father    Cancer Father        Colon   Breast cancer Cousin     Social History   Socioeconomic History   Marital status: Married    Spouse name: Alfredo Bach    Number of children: 3   Years of education: Not on file   Highest education level: 12th grade  Occupational History   Occupation: Education administrator: [employer]  Tobacco Use   Smoking status: Former    Current packs/day: 0.00    Average packs/day: 0.3 packs/day for 10.0 years (2.5 ttl pk-yrs)    Types: Cigarettes    Start date: 04/25/1983    Quit date: 04/24/1993    Years since quitting: 29.9   Smokeless tobacco: Never  Vaping Use   Vaping status: Never Used  Substance and Sexual Activity   Alcohol use: No    Alcohol/week: 0.0 standard drinks of alcohol   Drug use: No   Sexual activity: Not Currently  Other Topics Concern   Not on file  Social History Narrative    Daughter, son-in-law and grandson moved in with them 04/2018 to build a house but with COVID-19 their plans has been delayed    Social Determinants of Health   Financial Resource Strain: Low Risk  (03/13/2023)   Overall Financial Resource Strain (CARDIA)    Difficulty of Paying Living Expenses: Not hard at all  Food Insecurity: No Food Insecurity (03/13/2023)   Hunger Vital Sign    Worried About Running Out of Food in the  Last Year: Never true    Ran Out of Food in the Last Year: Never true  Transportation Needs: No Transportation Needs (03/13/2023)   PRAPARE - Administrator, Civil Service (Medical): No    Lack of Transportation (Non-Medical): No  Physical Activity: Insufficiently Active (03/13/2023)   Exercise Vital Sign    Days of Exercise per Week: 3 days    Minutes of Exercise per Session: 10 min  Stress: No Stress Concern Present (03/13/2023)   [employer] of Occupational Health - Occupational Stress Questionnaire    Feeling of Stress : Only a little  Social Connections: Unknown (03/13/2023)   Social Connection and Isolation Panel [NHANES]    Frequency of Communication with Friends and Family: Three times a week    Frequency of Social Gatherings with Friends and Family: Once a week    Attends Religious Services: Patient declined    Database administrator or Organizations: No    Attends Engineer, structural: Not on file    Marital Status: Married  Catering manager Violence: Not At Risk (03/14/2023)   Humiliation, Afraid, Rape, and Kick questionnaire    Fear of Current or Ex-Partner: No    Emotionally Abused: No    Physically Abused: No    Sexually Abused: No     Current Outpatient Medications:    Ergocalciferol (VITAMIN D2) 50 MCG (2000 UT) TABS, Take 1 tablet by mouth daily., Disp: 30 tablet, Rfl: 0   levothyroxine (SYNTHROID) 88 MCG tablet, TAKE 1 TABLET BY MOUTH DAILY  EXCEPT SKIP SUNDAYS, Disp: 78 tablet, Rfl: 3   LORazepam (ATIVAN) 0.5 MG  tablet, Take 1 tablet (0.5 mg total) by mouth at bedtime as needed for anxiety., Disp: 10 tablet, Rfl: 0   Methylcobalamin 1 MG CHEW, Chew 1 tablet by mouth daily., Disp: , Rfl:    rosuvastatin (CRESTOR) 10 MG tablet, Take 1 tablet (10 mg total) by mouth daily., Disp: 90 tablet, Rfl: 3  No Known Allergies   ROS  Constitutional: Negative for fever or weight change.  Respiratory: Negative for cough and shortness of breath.   Cardiovascular: Negative for chest pain or palpitations.  Gastrointestinal: Negative for abdominal pain, no bowel changes.  Musculoskeletal: Negative for gait problem or joint swelling.  Skin: Negative for rash.  Neurological: Negative for dizziness or headache.  No other specific complaints in a complete review of systems (except as listed in HPI above).   Objective  Vitals:   03/14/23 0741  BP: 132/74  Pulse: 93  Resp: 16  SpO2: 99%  Weight: 142 lb (64.4 kg)  Height: 5\' 3"  (1.6 m)    Body mass index is 25.15 kg/m.  Physical Exam  Constitutional: Patient appears well-developed and well-nourished. No distress.  HENT: Head: Normocephalic and atraumatic. Ears: B TMs ok, no erythema or effusion; Nose: Nose normal. Mouth/Throat: Oropharynx is clear and moist. No oropharyngeal exudate.  Eyes: Conjunctivae and EOM are normal. Pupils are equal, round, and reactive to light. No scleral icterus.  Neck: Normal range of motion. Neck supple. No JVD present. No thyromegaly present.  Cardiovascular: Normal rate, regular rhythm and normal heart sounds.  No murmur heard. No BLE edema. Pulmonary/Chest: Effort normal and breath sounds normal. No respiratory distress. Abdominal: Soft. Bowel sounds are normal, no distension. There is no tenderness. no masses Breast: no lumps or masses, no nipple discharge or rashes FEMALE GENITALIA:  Not done  RECTAL: not done  Musculoskeletal: Normal range of motion, no joint effusions. No  gross deformities Neurological: he is alert and  oriented to person, place, and time. No cranial nerve deficit. Coordination, balance, strength, speech and gait are normal.  Skin: Skin is warm and dry. No rash noted. No erythema.  Psychiatric: Patient has a normal mood and affect. behavior is normal. Judgment and thought content normal.    Fall Risk:    03/14/2023    7:41 AM 05/25/2022    8:24 AM 12/01/2021    8:57 AM 05/25/2021    7:43 AM 05/19/2020    7:52 AM  Fall Risk   Falls in the past year? 0 0 0 0 1  Number falls in past yr: 0 0 0 0 0  Injury with Fall? 0 0 0 0 1  Risk for fall due to : No Fall Risks No Fall Risks No Fall Risks No Fall Risks   Follow up Falls prevention discussed Falls prevention discussed Falls prevention discussed Falls prevention discussed      Functional Status Survey: Is the patient deaf or have difficulty hearing?: No Does the patient have difficulty seeing, even when wearing glasses/contacts?: No Does the patient have difficulty concentrating, remembering, or making decisions?: No Does the patient have difficulty walking or climbing stairs?: No Does the patient have difficulty dressing or bathing?: No Does the patient have difficulty doing errands alone such as visiting a doctor's office or shopping?: No   Assessment & Plan  1. Well adult exam  - B12 and Folate Panel - Lipid panel - TSH - CBC with Differential/Platelet - Hemoglobin A1c - VITAMIN D 25 Hydroxy (Vit-D Deficiency, Fractures) - MM 3D SCREENING MAMMOGRAM BILATERAL BREAST; Future - Comprehensive metabolic panel  2. Need for immunization against influenza  - Flu vaccine trivalent PF, 6mos and older(Flulaval,Afluria,Fluarix,Fluzone)  3. Breast cancer screening by mammogram  - MM 3D SCREENING MAMMOGRAM BILATERAL BREAST; Future    -USPSTF grade A and B recommendations reviewed with patient; age-appropriate recommendations, preventive care, screening tests, etc discussed and encouraged; healthy living encouraged; see AVS for  patient education given to patient -Discussed importance of 150 minutes of physical activity weekly, eat two servings of fish weekly, eat one serving of tree nuts ( cashews, pistachios, pecans, almonds.Marland Kitchen) every other day, eat 6 servings of fruit/vegetables daily and drink plenty of water and avoid sweet beverages.   -Reviewed Health Maintenance: Yes.

## 2023-03-14 ENCOUNTER — Encounter: Payer: Self-pay | Admitting: Family Medicine

## 2023-03-14 ENCOUNTER — Ambulatory Visit (INDEPENDENT_AMBULATORY_CARE_PROVIDER_SITE_OTHER): Payer: Managed Care, Other (non HMO) | Admitting: Family Medicine

## 2023-03-14 VITALS — BP 132/74 | HR 93 | Resp 16 | Ht 63.0 in | Wt 142.0 lb

## 2023-03-14 DIAGNOSIS — Z23 Encounter for immunization: Secondary | ICD-10-CM

## 2023-03-14 DIAGNOSIS — Z0000 Encounter for general adult medical examination without abnormal findings: Secondary | ICD-10-CM | POA: Diagnosis not present

## 2023-03-14 DIAGNOSIS — Z1231 Encounter for screening mammogram for malignant neoplasm of breast: Secondary | ICD-10-CM | POA: Diagnosis not present

## 2023-04-17 LAB — CBC WITH DIFFERENTIAL/PLATELET
Basophils Absolute: 0 10*3/uL (ref 0.0–0.2)
Basos: 1 %
EOS (ABSOLUTE): 0.1 10*3/uL (ref 0.0–0.4)
Eos: 2 %
Hematocrit: 42.1 % (ref 34.0–46.6)
Hemoglobin: 13.8 g/dL (ref 11.1–15.9)
Immature Grans (Abs): 0 10*3/uL (ref 0.0–0.1)
Immature Granulocytes: 0 %
Lymphocytes Absolute: 1.3 10*3/uL (ref 0.7–3.1)
Lymphs: 34 %
MCH: 31.7 pg (ref 26.6–33.0)
MCHC: 32.8 g/dL (ref 31.5–35.7)
MCV: 97 fL (ref 79–97)
Monocytes Absolute: 0.3 10*3/uL (ref 0.1–0.9)
Monocytes: 8 %
Neutrophils Absolute: 2.1 10*3/uL (ref 1.4–7.0)
Neutrophils: 55 %
Platelets: 174 10*3/uL (ref 150–450)
RBC: 4.35 x10E6/uL (ref 3.77–5.28)
RDW: 12.3 % (ref 11.7–15.4)
WBC: 3.8 10*3/uL (ref 3.4–10.8)

## 2023-04-17 LAB — TSH: TSH: 7.65 u[IU]/mL — ABNORMAL HIGH (ref 0.450–4.500)

## 2023-04-17 LAB — COMPREHENSIVE METABOLIC PANEL
ALT: 13 [IU]/L (ref 0–32)
AST: 20 [IU]/L (ref 0–40)
Albumin: 4.6 g/dL (ref 3.9–4.9)
Alkaline Phosphatase: 58 [IU]/L (ref 44–121)
BUN/Creatinine Ratio: 15 (ref 12–28)
BUN: 14 mg/dL (ref 8–27)
Bilirubin Total: 0.4 mg/dL (ref 0.0–1.2)
CO2: 23 mmol/L (ref 20–29)
Calcium: 9.4 mg/dL (ref 8.7–10.3)
Chloride: 106 mmol/L (ref 96–106)
Creatinine, Ser: 0.91 mg/dL (ref 0.57–1.00)
Globulin, Total: 1.9 g/dL (ref 1.5–4.5)
Glucose: 91 mg/dL (ref 70–99)
Potassium: 4.6 mmol/L (ref 3.5–5.2)
Sodium: 143 mmol/L (ref 134–144)
Total Protein: 6.5 g/dL (ref 6.0–8.5)
eGFR: 72 mL/min/{1.73_m2} (ref >59)

## 2023-04-17 LAB — LIPID PANEL
Chol/HDL Ratio: 2.3 {ratio} (ref 0.0–4.4)
Cholesterol, Total: 175 mg/dL (ref 100–199)
HDL: 75 mg/dL (ref >39)
LDL Chol Calc (NIH): 85 mg/dL (ref 0–99)
Triglycerides: 80 mg/dL (ref 0–149)
VLDL Cholesterol Cal: 15 mg/dL (ref 5–40)

## 2023-04-17 LAB — VITAMIN D 25 HYDROXY (VIT D DEFICIENCY, FRACTURES): Vit D, 25-Hydroxy: 42.8 ng/mL (ref 30.0–100.0)

## 2023-04-17 LAB — HEMOGLOBIN A1C
Est. average glucose Bld gHb Est-mCnc: 114 mg/dL
Hgb A1c MFr Bld: 5.6 % (ref 4.8–5.6)

## 2023-04-17 LAB — B12 AND FOLATE PANEL
Folate: 9.5 ng/mL (ref >3.0)
Vitamin B-12: 674 pg/mL (ref 232–1245)

## 2023-04-20 ENCOUNTER — Other Ambulatory Visit: Payer: Self-pay | Admitting: Family Medicine

## 2023-04-20 DIAGNOSIS — E039 Hypothyroidism, unspecified: Secondary | ICD-10-CM

## 2023-05-23 ENCOUNTER — Telehealth: Payer: Self-pay | Admitting: Family Medicine

## 2023-05-23 NOTE — Telephone Encounter (Signed)
Pt notified it was ordered for [facility]

## 2023-05-23 NOTE — Telephone Encounter (Signed)
Copied from CRM [id number]. Topic: General - Other >> May 23, 2023  2:35 PM Santiya F wrote: Reason for CRM: Pt is calling in requesting that the office send the orders for her thyroid check over to [facility] so she doesn't have to come in office to get them.

## 2023-05-28 ENCOUNTER — Ambulatory Visit: Payer: Managed Care, Other (non HMO) | Admitting: Family Medicine

## 2023-06-01 ENCOUNTER — Encounter: Payer: Self-pay | Admitting: Family Medicine

## 2023-06-01 LAB — TSH: TSH: 1.5 u[IU]/mL (ref 0.450–4.500)

## 2023-06-11 ENCOUNTER — Ambulatory Visit: Payer: Self-pay | Admitting: *Deleted

## 2023-06-11 NOTE — Telephone Encounter (Signed)
 Summary: Rx requested - possible UTI   Pt believes she might have a UTI. Patient states she is feeling burning sensation while urinating. Pt requesting something be called in to help.     Reason for Disposition  Age > 50 years  Answer Assessment - Initial Assessment Questions 1. SEVERITY: "How bad is the pain?"  (e.g., Scale 1-10; mild, moderate, or severe)   - MILD (1-3): complains slightly about urination hurting   - MODERATE (4-7): interferes with normal activities     - SEVERE (8-10): excruciating, unwilling or unable to urinate because of the pain      Mild 2. FREQUENCY: "How many times have you had painful urination today?"      3 times 3. PATTERN: "Is pain present every time you urinate or just sometimes?"      Every time 4. ONSET: "When did the painful urination start?"      Started last night 5. FEVER: "Do you have a fever?" If Yes, ask: "What is your temperature, how was it measured, and when did it start?"     no 6. PAST UTI: "Have you had a urine infection before?" If Yes, ask: "When was the last time?" and "What happened that time?"      Hx UTI- years- antibiotic 7. CAUSE: "What do you think is causing the painful urination?"  (e.g., UTI, scratch, Herpes sore)     UTI 8. OTHER SYMPTOMS: "Do you have any other symptoms?" (e.g., blood in urine, flank pain, genital sores, urgency, vaginal discharge)     no  Protocols used: Urination Pain - Female-A-AH

## 2023-06-11 NOTE — Telephone Encounter (Signed)
  Chief Complaint: urinary pain Symptoms: pain at end of stream- patient is afraid she has UTI Frequency: symptoms stated last night- it has been a long time since patient has had UTI Pertinent Negatives: Patient denies blood in urine, flank pain, genital sores, urgency, vaginal discharge Disposition: [] ED /[] Urgent Care (no appt availability in office) / [x] Appointment(In office/virtual)/ []  [facility]/ [] Home Care/ [] Refused Recommended Disposition /[] [facility]/ []  Follow-up with PCP Additional Notes: Patient called requesting treatment for UTI- patient advised of protocol- she has scheduled virtual appointment due to her qork schedule- she did ask for note to be forwarded to provider for consideration of call in antibiotic.  Patient advised I would do as requested.

## 2023-06-11 NOTE — Telephone Encounter (Signed)
 Has scheduled an appt for tomorrow

## 2023-06-12 ENCOUNTER — Encounter: Payer: Self-pay | Admitting: Family Medicine

## 2023-06-12 ENCOUNTER — Telehealth: Payer: Managed Care, Other (non HMO) | Admitting: Family Medicine

## 2023-06-12 DIAGNOSIS — R300 Dysuria: Secondary | ICD-10-CM | POA: Diagnosis not present

## 2023-06-12 DIAGNOSIS — N390 Urinary tract infection, site not specified: Secondary | ICD-10-CM

## 2023-06-12 MED ORDER — CIPROFLOXACIN HCL 250 MG PO TABS
250.0000 mg | ORAL_TABLET | Freq: Two times a day (BID) | ORAL | 0 refills | Status: AC
Start: 2023-06-12 — End: 2023-06-15

## 2023-06-12 NOTE — Progress Notes (Signed)
 Name: Taylor Gilmore   MRN: [id number]    DOB: [birth year]   Date:06/12/2023       Progress Note  Subjective  Chief Complaint  Chief Complaint  Patient presents with   Dysuria    X2 days    I connected with  Taylor Gilmore  on 06/12/23 at  8:20 AM EST by a video enabled telemedicine application and verified that I am speaking with the correct person using two identifiers.  I discussed the limitations of evaluation and management by telemedicine and the availability of in person appointments. The patient expressed understanding and agreed to proceed with a virtual visit  Staff also discussed with the patient that there may be a patient responsible charge related to this service. Patient Location: at home Provider Location: [facility] Additional Individuals present: alone  Discussed the use of AI scribe software for clinical note transcription with the patient, who gave verbal consent to proceed.  History of Present Illness   Taylor Gilmore is a 63 year old female who presents with dysuria.  She experiences a burning sensation during urination, particularly painful at the end of voiding. It has been years since her last urinary tract infection, but she describes the discomfort as a constricting pain at the end of urination. She notes an increase in water intake, leading to more frequent urination, including nocturia. She has a history of frequent urinary tract infections in the past, but not recently.  No fever, chills, back pain, nausea, or vomiting. She feels fine otherwise, with discomfort localized to the urinary tract area.        Patient Active Problem List   Diagnosis Date Noted   Family history of malignant neoplasm of gastrointestinal tract    Polyp of ascending colon    Osteopenia after menopause 03/27/2018   Anterior knee pain 01/10/2015   Symptomatic menopausal or female climacteric states 01/10/2015   Otosclerosis of both ears 01/10/2015   Allergic rhinitis  01/10/2015   Hypothyroidism, adult 01/04/2015   Dyslipidemia 01/04/2015   Overweight (BMI 25.0-29.9) 01/04/2015   Vitamin D deficiency 01/04/2015   B12 deficiency 01/04/2015   Left anterior knee pain 01/04/2015   Bilateral hearing loss 01/04/2015   Anxiety disorder 01/04/2015   Personal history of healed traumatic fracture 01/29/2013    Social History   Tobacco Use   Smoking status: Former    Current packs/day: 0.00    Average packs/day: 0.3 packs/day for 10.0 years (2.5 ttl pk-yrs)    Types: Cigarettes    Start date: 04/25/1983    Quit date: 04/24/1993    Years since quitting: 30.1   Smokeless tobacco: Never  Substance Use Topics   Alcohol use: No    Alcohol/week: 0.0 standard drinks of alcohol     Current Outpatient Medications:    Ergocalciferol (VITAMIN D2) 50 MCG (2000 UT) TABS, Take 1 tablet by mouth daily., Disp: 30 tablet, Rfl: 0   levothyroxine (SYNTHROID) 88 MCG tablet, TAKE 1 TABLET BY MOUTH DAILY  EXCEPT SKIP SUNDAYS, Disp: 78 tablet, Rfl: 3   LORazepam (ATIVAN) 0.5 MG tablet, Take 1 tablet (0.5 mg total) by mouth at bedtime as needed for anxiety., Disp: 10 tablet, Rfl: 0   Methylcobalamin 1 MG CHEW, Chew 1 tablet by mouth daily., Disp: , Rfl:    rosuvastatin (CRESTOR) 10 MG tablet, Take 1 tablet (10 mg total) by mouth daily., Disp: 90 tablet, Rfl: 3  No Known Allergies  I personally reviewed active problem list, medication list,  allergies with the patient/caregiver today.  ROS  Ten systems reviewed and is negative except as mentioned in HPI    Objective  Virtual encounter, vitals not obtained.  There is no height or weight on file to calculate BMI.  Nursing Note and Vital Signs reviewed.  Physical Exam   Awake, alert and oriented  Assessment and Plan    Urinary Tract Infection (UTI) Dysuria with discomfort at the end of voiding. No systemic symptoms such as fever, chills, nausea, or vomiting. No flank pain. History of recurrent UTIs, but none  recently. -Start Ciprofloxacin, 1 tablet twice daily for 3 days. -If symptoms persist, patient to come in for a urine sample and culture.      -Red flags and when to present for emergency care or RTC including fever >101.83F, chest pain, shortness of breath, new/worsening/un-resolving symptoms,  reviewed with patient at time of visit. Follow up and care instructions discussed and provided in AVS. - I discussed the assessment and treatment plan with the patient. The patient was provided an opportunity to ask questions and all were answered. The patient agreed with the plan and demonstrated an understanding of the instructions.  I provided 15  minutes of non-face-to-face time during this encounter.  Ruel Favors, MD

## 2023-08-16 ENCOUNTER — Ambulatory Visit
Admission: RE | Admit: 2023-08-16 | Discharge: 2023-08-16 | Disposition: A | Discharge status: Home / Self Care | Source: Ambulatory Visit | Attending: Family Medicine | Admitting: Family Medicine

## 2023-08-16 DIAGNOSIS — Z0000 Encounter for general adult medical examination without abnormal findings: Secondary | ICD-10-CM | POA: Diagnosis present

## 2023-08-16 DIAGNOSIS — Z1231 Encounter for screening mammogram for malignant neoplasm of breast: Secondary | ICD-10-CM | POA: Insufficient documentation

## 2023-08-19 ENCOUNTER — Other Ambulatory Visit: Payer: Self-pay | Admitting: Family Medicine

## 2023-08-19 DIAGNOSIS — E039 Hypothyroidism, unspecified: Secondary | ICD-10-CM

## 2023-12-13 ENCOUNTER — Other Ambulatory Visit: Payer: Self-pay | Admitting: Family Medicine

## 2023-12-13 DIAGNOSIS — E039 Hypothyroidism, unspecified: Secondary | ICD-10-CM

## 2023-12-14 ENCOUNTER — Ambulatory Visit: Admitting: Nurse Practitioner

## 2023-12-14 VITALS — BP 122/84 | HR 98 | Temp 97.9°F | Resp 16 | Ht 63.0 in | Wt 141.9 lb

## 2023-12-14 DIAGNOSIS — J321 Chronic frontal sinusitis: Secondary | ICD-10-CM

## 2023-12-14 DIAGNOSIS — Z131 Encounter for screening for diabetes mellitus: Secondary | ICD-10-CM | POA: Diagnosis not present

## 2023-12-14 DIAGNOSIS — F419 Anxiety disorder, unspecified: Secondary | ICD-10-CM

## 2023-12-14 DIAGNOSIS — E785 Hyperlipidemia, unspecified: Secondary | ICD-10-CM | POA: Diagnosis not present

## 2023-12-14 DIAGNOSIS — Z130 Encounter for screening for diseases of the blood and blood-forming organs and certain disorders involving the immune mechanism: Secondary | ICD-10-CM

## 2023-12-14 DIAGNOSIS — E039 Hypothyroidism, unspecified: Secondary | ICD-10-CM | POA: Diagnosis not present

## 2023-12-14 MED ORDER — BUSPIRONE HCL 5 MG PO TABS
5.0000 mg | ORAL_TABLET | Freq: Three times a day (TID) | ORAL | 1 refills | Status: AC | PRN
Start: 2023-12-14 — End: ?

## 2023-12-14 MED ORDER — LEVOTHYROXINE SODIUM 88 MCG PO TABS: 88.0000 ug | ORAL_TABLET | Freq: Every day | ORAL | 1 refills | Status: DC

## 2023-12-14 MED ORDER — CETIRIZINE HCL 10 MG PO TABS: 10.0000 mg | ORAL_TABLET | Freq: Every day | ORAL | 1 refills | Status: AC

## 2023-12-14 MED ORDER — FLUTICASONE PROPIONATE 50 MCG/ACT NA SUSP
2.0000 | Freq: Every day | NASAL | 6 refills | Status: AC
Start: 2023-12-14 — End: ?

## 2023-12-14 NOTE — Progress Notes (Signed)
 BP 122/84   Pulse 98   Temp 97.9 F (36.6 C)   Resp 16   Ht 5' 3 (1.6 m)   Wt 141 lb 14.4 oz (64.4 kg)   SpO2 97%   BMI 25.14 kg/m    Subjective:    Patient ID: Taylor Gilmore Alert, female    DOB: April 08, 1961, 63 y.o.   MRN: [id number]  HPI: [name] is a 63 y.o. female  Chief Complaint  Patient presents with   Medical Management of Chronic Issues   Medication Refill   Hypothyroidism   Nasal Congestion    Onset 2 months    Discussed the use of AI scribe software for clinical note transcription with the patient, who gave verbal consent to proceed.  History of Present Illness [name] is a 63 year old female who presents for a medication refill and chronic sinusitis.  Thyroid  hormone replacement therapy - Currently taking levothyroxine  88 mcg daily - Previously instructed to take levothyroxine  every day except Sundays; now taking daily - Last TSH in February was 1.5  Chronic sinusitis symptoms - Persistent symptoms for the past two months - Sensation of 'flea on my throat' that cannot be coughed up or swallowed - No current use of medication for sinusitis - Previously used Flonase  with good tolerance  Sedative use and sedation sensitivity - Prescribed lorazepam , uses very low dosage - Even a small piece of lorazepam  causes significant sedation - Uses lorazepam  occasionally for stress-related symptoms, such as feeling jittery or needing to leave crowded places or during stressful events  Metabolic health monitoring - History of dyslipidemia and prediabetes - Last lipid panel and A1c were within normal ranges         12/14/2023    1:28 PM 03/14/2023    7:41 AM 05/25/2022    8:24 AM  Depression screen PHQ 2/9  Decreased Interest 0 0 0  Down, Depressed, Hopeless 0 0 0  PHQ - 2 Score 0 0 0  Altered sleeping 0 0 0  Tired, decreased energy 0 0 0  Change in appetite 0 0 0  Feeling bad or failure about yourself  0 0 0  Trouble  concentrating 0 0 0  Moving slowly or fidgety/restless 0 0 0  Suicidal thoughts 0 0 0  PHQ-9 Score 0 0 0  Difficult doing work/chores Not difficult at all         12/14/2023    1:28 PM 08/23/2018    9:01 AM 01/22/2018   10:36 AM 01/15/2018    7:56 AM  GAD 7 : Generalized Anxiety Score  Nervous, Anxious, on Edge 0 0 0 0  Control/stop worrying 0 0 0 0  Worry too much - different things 0 0 0 0  Trouble relaxing 0 0 1 1  Restless 0 0 0 0  Easily annoyed or irritable 0 0 0 0  Afraid - awful might happen 0 0 0 0  Total GAD 7 Score 0 0 1 1  Anxiety Difficulty Not difficult at all        Relevant past medical, surgical, family and social history reviewed and updated as indicated. Interim medical history since our last visit reviewed. Allergies and medications reviewed and updated.  Review of Systems  Constitutional: Negative for fever or weight change.  Respiratory: Negative for cough and shortness of breath.   Cardiovascular: Negative for chest pain or palpitations.  Gastrointestinal: Negative for abdominal pain, no bowel changes.  Musculoskeletal: Negative for gait  problem or joint swelling.  Skin: Negative for rash.  Neurological: Negative for dizziness or headache.  No other specific complaints in a complete review of systems (except as listed in HPI above).      Objective:     BP 122/84   Pulse 98   Temp 97.9 F (36.6 C)   Resp 16   Ht 5' 3 (1.6 m)   Wt 141 lb 14.4 oz (64.4 kg)   SpO2 97%   BMI 25.14 kg/m    Wt Readings from Last 3 Encounters:  12/14/23 141 lb 14.4 oz (64.4 kg)  03/14/23 142 lb (64.4 kg)  05/25/22 157 lb (71.2 kg)    Physical Exam Physical Exam GENERAL: Alert, cooperative, well developed, no acute distress. HEENT: Normocephalic, normal oropharynx, moist mucous membranes. CHEST: Clear to auscultation bilaterally, no wheezes, rhonchi, or crackles. CARDIOVASCULAR: Normal heart rate and rhythm, S1 and S2 normal without murmurs. ABDOMEN: Soft,  non-tender, non-distended, without organomegaly, normal bowel sounds. EXTREMITIES: No cyanosis or edema. NEUROLOGICAL: Cranial nerves grossly intact, moves all extremities without gross motor or sensory deficit.   Results for orders placed or performed in visit on 04/20/23  TSH   Collection Time: 05/31/23  3:50 PM  Result Value Ref Range   TSH 1.500 0.450 - 4.500 uIU/mL          Assessment & Plan:   Problem List Items Addressed This Visit       Endocrine   Hypothyroidism, adult - Primary   Relevant Medications   levothyroxine  (SYNTHROID ) 88 MCG tablet   Other Relevant Orders   TSH     Other   Dyslipidemia   Relevant Orders   Lipid panel   Other Visit Diagnoses       Chronic frontal sinusitis       Relevant Medications   fluticasone  (FLONASE ) 50 MCG/ACT nasal spray   cetirizine  (ZYRTEC ) 10 MG tablet     Screening for diabetes mellitus       Relevant Orders   Comprehensive metabolic panel with GFR   Hemoglobin A1c     Screening for deficiency anemia       Relevant Orders   CBC with Differential/Platelet     Anxiety       Relevant Medications   busPIRone  (BUSPAR ) 5 MG tablet        Assessment and Plan Assessment & Plan Hypothyroidism Hypothyroidism is treated with levothyroxine  88 mcg daily. Last TSH was 1.5 in February. - Renew levothyroxine  prescription and send to [facility] in [place]. - Recheck thyroid  function with TSH test at [facility].  Chronic sinusitis Chronic sinusitis symptoms have persisted for the last two months, possibly due to allergies. Symptoms include throat discomfort without cough or cold. No prior treatment attempted. - Recommend taking Zyrtec  or Claritin daily for allergy management. - Recommend using Flonase  nasal spray daily.  Anxiety symptoms Intermittent anxiety symptoms include feeling jittery and overwhelmed in certain situations. Lorazepam  was previously used but caused excessive sedation. Symptoms may be  related to stress or situational triggers. - Prescribe Buspar  as needed for anxiety management.        Follow up plan: Return for cpe with sowles in NOvember.

## 2023-12-15 LAB — LIPID PANEL
Chol/HDL Ratio: 2.3 ratio (ref 0.0–4.4)
Cholesterol, Total: 171 mg/dL (ref 100–199)
HDL: 73 mg/dL (ref >39)
LDL Chol Calc (NIH): 83 mg/dL (ref 0–99)
Triglycerides: 82 mg/dL (ref 0–149)
VLDL Cholesterol Cal: 15 mg/dL (ref 5–40)

## 2023-12-15 LAB — CBC WITH DIFFERENTIAL/PLATELET
Basophils Absolute: 0 x10E3/uL (ref 0.0–0.2)
Basos: 1 %
EOS (ABSOLUTE): 0.1 x10E3/uL (ref 0.0–0.4)
Eos: 1 %
Hematocrit: 39.6 % (ref 34.0–46.6)
Hemoglobin: 13 g/dL (ref 11.1–15.9)
Immature Grans (Abs): 0 x10E3/uL (ref 0.0–0.1)
Immature Granulocytes: 0 %
Lymphocytes Absolute: 1.4 x10E3/uL (ref 0.7–3.1)
Lymphs: 27 %
MCH: 31.7 pg (ref 26.6–33.0)
MCHC: 32.8 g/dL (ref 31.5–35.7)
MCV: 97 fL (ref 79–97)
Monocytes Absolute: 0.4 x10E3/uL (ref 0.1–0.9)
Monocytes: 7 %
Neutrophils Absolute: 3.5 x10E3/uL (ref 1.4–7.0)
Neutrophils: 64 %
Platelets: 191 x10E3/uL (ref 150–450)
RBC: 4.1 x10E6/uL (ref 3.77–5.28)
RDW: 12.8 % (ref 11.7–15.4)
WBC: 5.4 x10E3/uL (ref 3.4–10.8)

## 2023-12-15 LAB — COMPREHENSIVE METABOLIC PANEL WITH GFR
ALT: 17 IU/L (ref 0–32)
AST: 23 IU/L (ref 0–40)
Albumin: 4.5 g/dL (ref 3.9–4.9)
Alkaline Phosphatase: 55 IU/L (ref 44–121)
BUN/Creatinine Ratio: 25 (ref 12–28)
BUN: 18 mg/dL (ref 8–27)
Bilirubin Total: 0.2 mg/dL (ref 0.0–1.2)
CO2: 24 mmol/L (ref 20–29)
Calcium: 9.6 mg/dL (ref 8.7–10.3)
Chloride: 103 mmol/L (ref 96–106)
Creatinine, Ser: 0.73 mg/dL (ref 0.57–1.00)
Globulin, Total: 2 g/dL (ref 1.5–4.5)
Glucose: 84 mg/dL (ref 70–99)
Potassium: 4.7 mmol/L (ref 3.5–5.2)
Sodium: 140 mmol/L (ref 134–144)
Total Protein: 6.5 g/dL (ref 6.0–8.5)
eGFR: 93 mL/min/1.73 (ref >59)

## 2023-12-15 LAB — HEMOGLOBIN A1C
Est. average glucose Bld gHb Est-mCnc: 108 mg/dL
Hgb A1c MFr Bld: 5.4 % (ref 4.8–5.6)

## 2023-12-15 LAB — TSH: TSH: 0.209 u[IU]/mL — ABNORMAL LOW (ref 0.450–4.500)

## 2023-12-17 ENCOUNTER — Ambulatory Visit: Payer: Self-pay | Admitting: Family Medicine

## 2023-12-17 DIAGNOSIS — E039 Hypothyroidism, unspecified: Secondary | ICD-10-CM

## 2024-03-17 NOTE — Patient Instructions (Signed)
 Preventive Care 62-62 Years Old, Female  Preventive care refers to lifestyle choices and visits with your health care provider that can promote health and wellness. Preventive care visits are also called wellness exams.  What can I expect for my preventive care visit?  Counseling  Your health care provider may ask you questions about your:  Medical history, including:  Past medical problems.  Family medical history.  Pregnancy history.  Current health, including:  Menstrual cycle.  Method of birth control.  Emotional well-being.  Home life and relationship well-being.  Sexual activity and sexual health.  Lifestyle, including:  Alcohol, nicotine or tobacco, and drug use.  Access to firearms.  Diet, exercise, and sleep habits.  Work and work Astronomer.  Sunscreen use.  Safety issues such as seatbelt and bike helmet use.  Physical exam  Your health care provider will check your:  Height and weight. These may be used to calculate your BMI (body mass index). BMI is a measurement that tells if you are at a healthy weight.  Waist circumference. This measures the distance around your waistline. This measurement also tells if you are at a healthy weight and may help predict your risk of certain diseases, such as type 2 diabetes and high blood pressure.  Heart rate and blood pressure.  Body temperature.  Skin for abnormal spots.  What immunizations do I need?    Vaccines are usually given at various ages, according to a schedule. Your health care provider will recommend vaccines for you based on your age, medical history, and lifestyle or other factors, such as travel or where you work.  What tests do I need?  Screening  Your health care provider may recommend screening tests for certain conditions. This may include:  Lipid and cholesterol levels.  Diabetes screening. This is done by checking your blood sugar (glucose) after you have not eaten for a while (fasting).  Pelvic exam and Pap test.  Hepatitis B test.  Hepatitis C  test.  HIV (human immunodeficiency virus) test.  STI (sexually transmitted infection) testing, if you are at risk.  Lung cancer screening.  Colorectal cancer screening.  Mammogram. Talk with your health care provider about when you should start having regular mammograms. This may depend on whether you have a family history of breast cancer.  BRCA-related cancer screening. This may be done if you have a family history of breast, ovarian, tubal, or peritoneal cancers.  Bone density scan. This is done to screen for osteoporosis.  Talk with your health care provider about your test results, treatment options, and if necessary, the need for more tests.  Follow these instructions at home:  Eating and drinking    Eat a diet that includes fresh fruits and vegetables, whole grains, lean protein, and low-fat dairy products.  Take vitamin and mineral supplements as recommended by your health care provider.  Do not drink alcohol if:  Your health care provider tells you not to drink.  You are pregnant, may be pregnant, or are planning to become pregnant.  If you drink alcohol:  Limit how much you have to 0-1 drink a day.  Know how much alcohol is in your drink. In the [place] one drink equals one 12 oz bottle of beer (355 mL), one 5 oz glass of wine (148 mL), or one 1 oz glass of hard liquor (44 mL).  Lifestyle  Brush your teeth every morning and night with fluoride toothpaste. Floss one time each day.  Exercise for at least  30 minutes 5 or more days each week.  Do not use any products that contain nicotine or tobacco. These products include cigarettes, chewing tobacco, and vaping devices, such as e-cigarettes. If you need help quitting, ask your health care provider.  Do not use drugs.  If you are sexually active, practice safe sex. Use a condom or other form of protection to prevent STIs.  If you do not wish to become pregnant, use a form of birth control. If you plan to become pregnant, see your health care provider for a  prepregnancy visit.  Take aspirin only as told by your health care provider. Make sure that you understand how much to take and what form to take. Work with your health care provider to find out whether it is safe and beneficial for you to take aspirin daily.  Find healthy ways to manage stress, such as:  Meditation, yoga, or listening to music.  Journaling.  Talking to a trusted person.  Spending time with friends and family.  Minimize exposure to UV radiation to reduce your risk of skin cancer.  Safety  Always wear your seat belt while driving or riding in a vehicle.  Do not drive:  If you have been drinking alcohol. Do not ride with someone who has been drinking.  When you are tired or distracted.  While texting.  If you have been using any mind-altering substances or drugs.  Wear a helmet and other protective equipment during sports activities.  If you have firearms in your house, make sure you follow all gun safety procedures.  Seek help if you have been physically or sexually abused.  What's next?  Visit your health care provider once a year for an annual wellness visit.  Ask your health care provider how often you should have your eyes and teeth checked.  Stay up to date on all vaccines.  This information is not intended to replace advice given to you by your health care provider. Make sure you discuss any questions you have with your health care provider.  Document Revised: 10/06/2020 Document Reviewed: 10/06/2020  Elsevier Patient Education  2024 [employer].

## 2024-03-18 ENCOUNTER — Encounter: Payer: Self-pay | Admitting: Family Medicine

## 2024-03-18 ENCOUNTER — Ambulatory Visit (INDEPENDENT_AMBULATORY_CARE_PROVIDER_SITE_OTHER): Admitting: Family Medicine

## 2024-03-18 VITALS — BP 124/78 | HR 98 | Resp 16 | Ht 63.0 in | Wt 141.0 lb

## 2024-03-18 DIAGNOSIS — Z0001 Encounter for general adult medical examination with abnormal findings: Secondary | ICD-10-CM

## 2024-03-18 DIAGNOSIS — Z1211 Encounter for screening for malignant neoplasm of colon: Secondary | ICD-10-CM

## 2024-03-18 DIAGNOSIS — M8580 Other specified disorders of bone density and structure, unspecified site: Secondary | ICD-10-CM

## 2024-03-18 DIAGNOSIS — Z1231 Encounter for screening mammogram for malignant neoplasm of breast: Secondary | ICD-10-CM

## 2024-03-18 DIAGNOSIS — Z124 Encounter for screening for malignant neoplasm of cervix: Secondary | ICD-10-CM

## 2024-03-18 DIAGNOSIS — Z23 Encounter for immunization: Secondary | ICD-10-CM

## 2024-03-18 DIAGNOSIS — Z780 Asymptomatic menopausal state: Secondary | ICD-10-CM

## 2024-03-18 DIAGNOSIS — Z0000 Encounter for general adult medical examination without abnormal findings: Secondary | ICD-10-CM

## 2024-03-18 NOTE — Progress Notes (Signed)
 Name: Taylor Gilmore   MRN: [id number]    DOB: 07-14-1960   Date:03/18/2024       Progress Note  Subjective  Chief Complaint  Chief Complaint  Patient presents with   Annual Exam    HPI  Patient presents for annual CPE.  Discussed the use of AI scribe software for clinical note transcription with the patient, who gave verbal consent to proceed.  History of Present Illness Taylor Gilmore is a 63 year old female who presents for a routine follow-up and immunization update.  She received her flu shot today and discussed the possibility of receiving the pneumococcal PCV20 vaccine, which she has not received previously. She has received a shingles vaccine and a tetanus shot in 2020.  She is due for a colonoscopy and plans to schedule it with Dr. Edith, who has performed the procedure for her in the past. Her last Pap smear was in 2023, and she prefers to have it done more frequently than every five years as covered by her insurance.  She has a history of osteopenia and monitors her bone density. Her last bone density test was done last year.  She has hearing loss, particularly in her right ear due to childhood ear infections, and uses a hearing aid in her left ear. She finds it challenging to hear in noisy environments and relies on a coworker to help her understand conversations at work.  She has a history of hyperlipidemia and hypothyroidism. She takes 88 mcg of thyroid  medication daily. Her last TSH was suppressed, and she is due for a follow-up blood test.  No chest pain, palpitations, nausea, or vomiting. She experiences urgency with urination but no incontinence with coughing or sneezing. She is not sexually active and feels safe in her relationships.  Her diet includes yogurt, apples, bananas, eggs, avocado, and chicken wraps. She acknowledges needing to increase her vegetable intake. 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  She uses stairs instead of the elevator to increase her physical activity.  She quit smoking in 1995 and does not qualify for lung cancer screening. She has no significant family history of breast, ovarian, or colon cancer, though a cousin had breast cancer.   Diet: balanced diet  Exercise: needs to increase physical activity   Last Eye Exam: completed Last Dental Exam: completed  Flowsheet Row Office Visit from 03/18/2024 in [facility]  AUDIT-C Score 0   Depression: Phq 9 is  negative    03/18/2024    1:03 PM 12/14/2023    1:28 PM 03/14/2023    7:41 AM 05/25/2022    8:24 AM 12/01/2021    8:57 AM  Depression screen PHQ 2/9  Decreased Interest 0 0 0 0 0  Down, Depressed, Hopeless 0 0 0 0 0  PHQ - 2 Score 0 0 0 0 0  Altered sleeping  0 0 0 0  Tired, decreased energy  0 0 0 0  Change in appetite  0 0 0 0  Feeling bad or failure about yourself   0 0 0 0  Trouble concentrating  0 0 0 0  Moving slowly or fidgety/restless  0 0 0 0  Suicidal thoughts  0 0 0 0  PHQ-9 Score  0  0  0  0   Difficult doing work/chores  Not difficult at all        Data saved with a previous flowsheet  row definition   Hypertension: BP Readings from Last 3 Encounters:  03/18/24 124/78  12/14/23 122/84  03/14/23 132/74   Obesity: Wt Readings from Last 3 Encounters:  03/18/24 141 lb (64 kg)  12/14/23 141 lb 14.4 oz (64.4 kg)  03/14/23 142 lb (64.4 kg)   BMI Readings from Last 3 Encounters:  03/18/24 24.98 kg/m  12/14/23 25.14 kg/m  03/14/23 25.15 kg/m     Vaccines: reviewed with the patient.   Hep C Screening: completed STD testing and prevention (HIV/chl/gon/syphilis): N/A Intimate partner violence: negative screen  Sexual History : not currently  Menstrual History/LMP/Abnormal Bleeding: post menopausal  Discussed importance of follow up if any post-menopausal bleeding: yes  Incontinence Symptoms: positive for symptoms fur urge incontinence occasionally    Breast cancer:  - Last Mammogram: up to date  - BRCA gene screening: N/A  Osteoporosis Prevention : Discussed high calcium  and vitamin D  supplementation, weight bearing exercises Bone density :yes   Cervical cancer screening: performing today  Skin cancer: Discussed monitoring for atypical lesions  Colorectal cancer: we will place a referral    Lung cancer:  Low Dose CT Chest recommended if Age 14-80 years, 20 pack-year currently smoking OR have quit w/in 15years. Patient does not qualify for screen   ECG: repeat next visit   Advanced Care Planning: A voluntary discussion about advance care planning including the explanation and discussion of advance directives.  Discussed health care proxy and Living will, and the patient was able to identify a health care proxy as husband .  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   Allergic rhinitis 01/10/2015   Hypothyroidism, adult 01/04/2015   Dyslipidemia 01/04/2015   Overweight (BMI 25.0-29.9) 01/04/2015   Vitamin D  deficiency 01/04/2015   B12 deficiency 01/04/2015   Left anterior knee pain 01/04/2015   Bilateral hearing loss 01/04/2015   Anxiety disorder 01/04/2015   Personal history of healed traumatic fracture 01/29/2013    Past Surgical History:  Procedure Laterality Date   COLONOSCOPY WITH PROPOFOL  N/A 03/14/2019   Procedure: COLONOSCOPY WITH BIOPSY;  Surgeon: Jinny Carmine, MD;  Location: [facility];  Service: Endoscopy;  Laterality: N/A;   EXTERNAL EAR SURGERY Right    6th grade   KNEE SURGERY Right    ORIF TOE FRACTURE Left 08/26/2019   Procedure: OPEN REDUCTION  INTERNAL FIXATION (ORIF) METATARSAL (TOE) FRACTURE LEFT 5TH;  Surgeon: Lennie Barter, DPM;  Location: [facility];  Service: Podiatry;  Laterality: Left;  CHOICE LOCAL BLOCK   POLYPECTOMY N/A 03/14/2019   Procedure: POLYPECTOMY;  Surgeon: Jinny Carmine, MD;  Location: [facility];  Service: Endoscopy;  Laterality: N/A;    Family History  Problem Relation Age of Onset   Hypertension Mother    Alcohol abuse Father    Cancer Father        Colon   Breast cancer Cousin     Social History   Socioeconomic History   Marital status: Married    Spouse name: Laurier    Number of children: 3   Years of education: Not on file   Highest education level: 12th grade  Occupational  History   Occupation: Education Administrator: [employer]  Tobacco Use   Smoking status: Former    Current packs/day: 0.00    Average packs/day: 0.3 packs/day for 10.0 years (2.5 ttl pk-yrs)    Types: Cigarettes    Start date: 04/25/1983    Quit date: 04/24/1993    Years since quitting: 30.9   Smokeless tobacco: Never  Vaping Use   Vaping status: Never Used  Substance and Sexual Activity   Alcohol use: No    Alcohol/week: 0.0 standard drinks of alcohol   Drug use: No   Sexual activity: Not Currently  Other Topics Concern   Not on file  Social History Narrative   Daughter, son-in-law and grandson moved in with them 04/2018 to build a house but with COVID-19 their plans has been delayed    Social Drivers of Corporate Investment Banker Strain: Low Risk  (12/14/2023)   Overall Financial Resource Strain (CARDIA)    Difficulty of Paying Living Expenses: Not hard at all  Food Insecurity: No Food Insecurity (12/14/2023)   Hunger Vital Sign    Worried About Running Out of Food in the Last Year: Never true    Ran Out of Food in the Last Year: Never true  Transportation Needs: No Transportation Needs (12/14/2023)   PRAPARE - Administrator, Civil Service (Medical): No    Lack of Transportation  (Non-Medical): No  Physical Activity: Insufficiently Active (12/14/2023)   Exercise Vital Sign    Days of Exercise per Week: 3 days    Minutes of Exercise per Session: 30 min  Stress: Stress Concern Present (12/14/2023)   [employer] of Occupational Health - Occupational Stress Questionnaire    Feeling of Stress: To some extent  Social Connections: Moderately Isolated (12/14/2023)   Social Connection and Isolation Panel    Frequency of Communication with Friends and Family: More than three times a week    Frequency of Social Gatherings with Friends and Family: Once a week    Attends Religious Services: Patient declined    Database Administrator or Organizations: No    Attends Engineer, Structural: Not on file    Marital Status: Married  Catering Manager Violence: Not At Risk (03/18/2024)   Humiliation, Afraid, Rape, and Kick questionnaire    Fear of Current or Ex-Partner: No    Emotionally Abused: No    Physically Abused: No    Sexually Abused: No     Current Outpatient Medications:    busPIRone  (BUSPAR ) 5 MG tablet, Take 1 tablet (5 mg total) by mouth 3 (three) times daily as needed., Disp: 30 tablet, Rfl: 1   fluticasone  (FLONASE ) 50 MCG/ACT nasal spray, Place 2 sprays into both nostrils daily., Disp: 16 g, Rfl: 6   levothyroxine  (SYNTHROID ) 88 MCG tablet, Take 1 tablet (88 mcg total) by mouth daily before breakfast. TAKE 1 TABLET BY MOUTH DAILY  EXCEPT SKIP SUNDAYS (Patient taking differently: Take 88 mcg by mouth daily before breakfast. TAKE 1 TABLET BY MOUTH DAILY  EXCEPT 1/2 pill on SUNDAYS), Disp: 90 tablet, Rfl: 1   Methylcobalamin 1 MG CHEW, Chew 1 tablet by mouth daily., Disp: , Rfl:    rosuvastatin  (CRESTOR ) 10 MG tablet, Take 1 tablet (10 mg total) by mouth daily., Disp: 90 tablet, Rfl: 3   cetirizine  (ZYRTEC ) 10 MG tablet, Take 1 tablet (10 mg total) by mouth daily. (Patient not taking: Reported on 03/18/2024), Disp: 90 tablet, Rfl: 1  No Known  Allergies   ROS  Constitutional: Negative for fever or weight change.  Respiratory: Negative for cough and shortness of breath.   Cardiovascular: Negative for chest pain or palpitations.  Gastrointestinal: Negative for abdominal pain, no bowel changes.  Musculoskeletal: Negative for gait problem or joint swelling.  Skin: Negative for rash.  Neurological: Negative for dizziness or headache.  No other specific complaints in a complete review of systems (except as listed in HPI above).   Objective  Vitals:   03/18/24 1310  BP: 124/78  Pulse: 98  Resp: 16  SpO2: 99%  Weight: 141 lb (64 kg)  Height: 5' 3 (1.6 m)    Body mass index is 24.98 kg/m.  Physical Exam  Constitutional: Patient appears well-developed and well-nourished. No distress.  HENT: Head: Normocephalic and atraumatic. Ears: B TMs ok, no erythema or effusion; Nose: Nose normal. Mouth/Throat: Oropharynx is clear and moist. No oropharyngeal exudate.  Eyes: Conjunctivae and EOM are normal. Pupils are equal, round, and reactive to light. No scleral icterus.  Neck: Normal range of motion. Neck supple. No JVD present. No thyromegaly present.  Cardiovascular: Normal rate, regular rhythm and normal heart sounds.  No murmur heard. No BLE edema. Pulmonary/Chest: Effort normal and breath sounds normal. No respiratory distress. Abdominal: Soft. Bowel sounds are normal, no distension. There is no tenderness. no masses Breast: no lumps or masses, no nipple discharge or rashes FEMALE GENITALIA:  External genitalia normal External urethra normal Vaginal vault normal without discharge or lesions Cervix normal without discharge or lesions Bimanual exam normal without masses RECTAL: not done  Musculoskeletal: Normal range of motion, no joint effusions. No gross deformities Neurological: he is alert and oriented to person, place, and time. No cranial nerve deficit. Coordination, balance, strength, speech and gait are normal.   Skin: Skin is warm and dry. No rash noted. No erythema.  Psychiatric: Patient has a normal mood and affect. behavior is normal. Judgment and thought content normal.      Assessment & Plan Adult Wellness Visit Emphasized balanced diet and regular physical activity. - Encouraged balanced diet with increased vegetable intake. - Encouraged regular physical activity, including walking and stair climbing.  Immunization management Reviewed immunization status. Administered flu shot. Discussed pneumococcal PCV20 and RSV vaccines. - Administered flu shot. - Ordered pneumococcal PCV20 vaccine. - Discussed RSV vaccine option.  Screening for malignant neoplasm of colon Due for colonoscopy. Discussed scheduling and bowel prep options. - Sent referral to Dr. Edith for colonoscopy. - Coordinated with referral coordinator for morning appointment. - Discussed bowel prep options.  Screening for malignant neoplasm of cervix Due for cervical cancer screening. Discussed Pap smear with HPV testing. - Ordered Pap smear with HPV testing through [facility].  Screening mammogram for malignant neoplasm of breast Mammogram done in April 2025. Discussed scheduling with bone density test in April 2026. - Ordered mammogram and bone density together in April 2026.  Osteopenia after menopause Osteopenia diagnosed last year. Discussed monitoring bone density. - Ordered bone density test with mammogram in April 2026.  Hearing loss, bilateral Bilateral hearing loss with left ear hearing aid. Discussed right ear hearing aid placement. - Discussed home hearing aid placement for right ear.  Menopausal state Postmenopausal with no current symptoms. Discussed monitoring for postmenopausal bleeding. - Monitor for postmenopausal bleeding.  Urinary urgency Occasional urinary urgency without significant issues.        -USPSTF grade A and B recommendations reviewed with patient; age-appropriate recommendations,  preventive care, screening tests, etc discussed  and encouraged; healthy living encouraged; see AVS for patient education given to patient -Discussed importance of 150 minutes of physical activity weekly, eat two servings of fish weekly, eat one serving of tree nuts ( cashews, pistachios, pecans, almonds.SABRA) every other day, eat 6 servings of fruit/vegetables daily and drink plenty of water  and avoid sweet beverages.   -Reviewed Health Maintenance: Yes.

## 2024-03-25 ENCOUNTER — Ambulatory Visit: Payer: Self-pay | Admitting: Family Medicine

## 2024-03-25 LAB — IGP,CTNGTV,APT HPV
Chlamydia, Nuc. Acid Amp: NEGATIVE
Gonococcus, Nuc. Acid Amp: NEGATIVE
HPV Aptima: NEGATIVE
Trich vag by NAA: NEGATIVE

## 2024-03-27 ENCOUNTER — Telehealth: Payer: Self-pay

## 2024-03-27 ENCOUNTER — Other Ambulatory Visit: Payer: Self-pay

## 2024-03-27 DIAGNOSIS — Z860100 Personal history of colon polyps, unspecified: Secondary | ICD-10-CM

## 2024-03-27 MED ORDER — NA SULFATE-K SULFATE-MG SULF 17.5-3.13-1.6 GM/177ML PO SOLN: 354.0000 mL | Freq: Once | ORAL | 0 refills | Status: AC

## 2024-03-27 NOTE — Telephone Encounter (Signed)
 Gastroenterology Pre-Procedure Review  Request Date: 05/16/2024 Requesting Physician: Dr. Melany  PATIENT REVIEW QUESTIONS: The patient responded to the following health history questions as indicated:    1. Are you having any GI issues? no 2. Do you have a personal history of Polyps? yes (02/2019 Dr. Jinny Polyps) 3. Do you have a family history of Colon Cancer or Polyps? no 4. Diabetes Mellitus? no 5. Joint replacements in the past 12 months?no 6. Major health problems in the past 3 months?no 7. Any artificial heart valves, MVP, or defibrillator?no    MEDICATIONS & ALLERGIES:    Patient reports the following regarding taking any anticoagulation/antiplatelet therapy:   Plavix, Coumadin, Eliquis , Xarelto, Lovenox, Pradaxa, Brilinta, or Effient? no Aspirin ? no  Patient confirms/reports the following medications:  Current Outpatient Medications  Medication Sig Dispense Refill   Na Sulfate-K Sulfate-Mg Sulfate concentrate (SUPREP) 17.5-3.13-1.6 GM/177ML SOLN Take 1 kit (354 mLs total) by mouth once for 1 dose. Starting at 5 PM take one bottle and pour into the supplied cup, add cool water  to the fill 16 oz line and drink all. Then 5 hours before procedure pour the second bottle into the supplied cup, add cool water  to the fill 16 oz line and drink all. 354 mL 0   busPIRone  (BUSPAR ) 5 MG tablet Take 1 tablet (5 mg total) by mouth 3 (three) times daily as needed. 30 tablet 1   cetirizine  (ZYRTEC ) 10 MG tablet Take 1 tablet (10 mg total) by mouth daily. (Patient not taking: Reported on 03/18/2024) 90 tablet 1   fluticasone  (FLONASE ) 50 MCG/ACT nasal spray Place 2 sprays into both nostrils daily. 16 g 6   levothyroxine  (SYNTHROID ) 88 MCG tablet Take 1 tablet (88 mcg total) by mouth daily before breakfast. TAKE 1 TABLET BY MOUTH DAILY  EXCEPT SKIP SUNDAYS (Patient taking differently: Take 88 mcg by mouth daily before breakfast. TAKE 1 TABLET BY MOUTH DAILY  EXCEPT 1/2 pill on SUNDAYS) 90 tablet 1    Methylcobalamin 1 MG CHEW Chew 1 tablet by mouth daily.     rosuvastatin  (CRESTOR ) 10 MG tablet Take 1 tablet (10 mg total) by mouth daily. 90 tablet 3   No current facility-administered medications for this visit.    Patient confirms/reports the following allergies:  No Known Allergies  No orders of the defined types were placed in this encounter.   AUTHORIZATION INFORMATION Primary Insurance: 1D#: Group #:  Secondary Insurance: 1D#: Group #:  SCHEDULE INFORMATION: Date: 05/16/2024 Time: Location: [facility] Dr. Melany

## 2024-04-03 LAB — TSH: TSH: 4.09 u[IU]/mL (ref 0.450–4.500)

## 2024-04-04 NOTE — Addendum Note (Signed)
 Addended by: GLENARD MIRE F on: 04/04/2024 12:41 PM   Modules accepted: Orders

## 2024-04-07 ENCOUNTER — Other Ambulatory Visit: Payer: Self-pay | Admitting: Family Medicine

## 2024-04-07 DIAGNOSIS — E039 Hypothyroidism, unspecified: Secondary | ICD-10-CM

## 2024-04-07 MED ORDER — LEVOTHYROXINE SODIUM 75 MCG PO TABS: 75.0000 ug | ORAL_TABLET | Freq: Every day | ORAL | 0 refills | Status: AC

## 2024-04-07 MED ORDER — LEVOTHYROXINE SODIUM 75 MCG PO TABS: 75.0000 ug | ORAL_TABLET | Freq: Every day | ORAL | 0 refills | Status: DC

## 2024-05-02 ENCOUNTER — Telehealth: Payer: Self-pay

## 2024-05-02 NOTE — Telephone Encounter (Signed)
 Per pt need to change location of her procedure from [place] to [facility] . Pt also sending updated insurance information .

## 2024-05-05 NOTE — Telephone Encounter (Signed)
 Patient has been contacted this morning to reschedule her colonoscopy procedure from [facility] to [facility].  Procedure has been rescheduled from 05/16/24 with Dr. Melany to 08/07/24 at [facility] with Dr. Jinny instead.  Secure staff message sent to Kim at [facility]  Instructions updated.  Referral updated.  Thanks,  [place], CMA

## 2024-05-16 ENCOUNTER — Ambulatory Visit: Admission: RE | Admit: 2024-05-16 | Payer: Self-pay | Source: Home / Self Care | Admitting: Gastroenterology

## 2024-05-16 ENCOUNTER — Encounter: Admission: RE | Payer: Self-pay | Source: Home / Self Care

## 2024-05-16 SURGERY — COLONOSCOPY
Anesthesia: General

## 2024-09-16 ENCOUNTER — Ambulatory Visit: Admitting: Family Medicine
